# Patient Record
Sex: Male | Born: 1935 | Race: White | Hispanic: No | Marital: Married | Smoking: Never smoker
Health system: Southern US, Community
[De-identification: ages and names within clinical notes are randomized; demographics above are authoritative.]

## PROBLEM LIST (undated history)

## (undated) DIAGNOSIS — J90 Pleural effusion, not elsewhere classified: Secondary | ICD-10-CM

## (undated) DIAGNOSIS — I219 Acute myocardial infarction, unspecified: Secondary | ICD-10-CM

## (undated) DIAGNOSIS — G309 Alzheimer's disease, unspecified: Secondary | ICD-10-CM

## (undated) DIAGNOSIS — R002 Palpitations: Secondary | ICD-10-CM

## (undated) DIAGNOSIS — F0280 Dementia in other diseases classified elsewhere without behavioral disturbance: Secondary | ICD-10-CM

## (undated) DIAGNOSIS — G4734 Idiopathic sleep related nonobstructive alveolar hypoventilation: Secondary | ICD-10-CM

## (undated) DIAGNOSIS — C459 Mesothelioma, unspecified: Secondary | ICD-10-CM

## (undated) DIAGNOSIS — I469 Cardiac arrest, cause unspecified: Secondary | ICD-10-CM

## (undated) DIAGNOSIS — I959 Hypotension, unspecified: Secondary | ICD-10-CM

## (undated) DIAGNOSIS — C61 Malignant neoplasm of prostate: Secondary | ICD-10-CM

## (undated) DIAGNOSIS — E785 Hyperlipidemia, unspecified: Secondary | ICD-10-CM

## (undated) DIAGNOSIS — Z87442 Personal history of urinary calculi: Secondary | ICD-10-CM

## (undated) DIAGNOSIS — G931 Anoxic brain damage, not elsewhere classified: Secondary | ICD-10-CM

## (undated) DIAGNOSIS — R011 Cardiac murmur, unspecified: Secondary | ICD-10-CM

## (undated) DIAGNOSIS — J61 Pneumoconiosis due to asbestos and other mineral fibers: Secondary | ICD-10-CM

## (undated) DIAGNOSIS — J189 Pneumonia, unspecified organism: Secondary | ICD-10-CM

## (undated) DIAGNOSIS — I509 Heart failure, unspecified: Secondary | ICD-10-CM

## (undated) DIAGNOSIS — R0600 Dyspnea, unspecified: Secondary | ICD-10-CM

## (undated) DIAGNOSIS — Z8669 Personal history of other diseases of the nervous system and sense organs: Secondary | ICD-10-CM

## (undated) HISTORY — PX: TUBE THORACOSTOMY (ARMC HX): HXRAD1264

## (undated) HISTORY — DX: Anoxic brain damage, not elsewhere classified: G93.1

## (undated) HISTORY — DX: Malignant neoplasm of prostate: C61

## (undated) HISTORY — DX: Acute myocardial infarction, unspecified: I21.9

## (undated) HISTORY — DX: Cardiac murmur, unspecified: R01.1

## (undated) HISTORY — DX: Dementia in other diseases classified elsewhere, unspecified severity, without behavioral disturbance, psychotic disturbance, mood disturbance, and anxiety: F02.80

## (undated) HISTORY — PX: CARDIAC SURGERY: SHX584

## (undated) HISTORY — DX: Cardiac arrest, cause unspecified: I46.9

## (undated) HISTORY — DX: Pleural effusion, not elsewhere classified: J90

## (undated) HISTORY — DX: Alzheimer's disease, unspecified: G30.9

## (undated) HISTORY — DX: Hyperlipidemia, unspecified: E78.5

## (undated) HISTORY — DX: Mesothelioma, unspecified: C45.9

## (undated) HISTORY — PX: THORACENTESIS: SHX235

---

## 2007-10-17 HISTORY — PX: PROSTATECTOMY: SHX69

## 2008-11-15 HISTORY — PX: INGUINAL HERNIA REPAIR: SUR1180

## 2009-12-26 LAB — HM COLONOSCOPY

## 2010-05-18 DIAGNOSIS — I219 Acute myocardial infarction, unspecified: Secondary | ICD-10-CM

## 2010-05-18 HISTORY — DX: Acute myocardial infarction, unspecified: I21.9

## 2012-05-18 HISTORY — PX: CORONARY ARTERY BYPASS GRAFT: SHX141

## 2016-05-13 LAB — PULMONARY FUNCTION TEST

## 2016-10-15 LAB — BASIC METABOLIC PANEL
BUN: 14 (ref 4–21)
CREATININE: 0.8 (ref 0.6–1.3)
GLUCOSE: 74
POTASSIUM: 4 (ref 3.4–5.3)
SODIUM: 141 (ref 137–147)

## 2016-10-15 LAB — CBC AND DIFFERENTIAL
HCT: 39 — AB (ref 41–53)
Hemoglobin: 11.7 — AB (ref 13.5–17.5)
NEUTROS ABS: 4
Platelets: 218 (ref 150–399)
WBC: 5.1

## 2016-10-15 LAB — HEPATIC FUNCTION PANEL
ALT: 18 (ref 10–40)
AST: 19 (ref 14–40)
Alkaline Phosphatase: 78 (ref 25–125)
BILIRUBIN, TOTAL: 0.7

## 2016-10-15 LAB — PSA

## 2016-10-15 LAB — LIPID PANEL
Cholesterol: 103 (ref 0–200)
HDL: 37 (ref 35–70)
LDL CALC: 57
TRIGLYCERIDES: 46 (ref 40–160)

## 2016-11-17 ENCOUNTER — Encounter: Payer: Self-pay | Admitting: Neurology

## 2016-12-23 ENCOUNTER — Encounter: Payer: Self-pay | Admitting: Family Medicine

## 2016-12-23 ENCOUNTER — Encounter: Payer: Self-pay | Admitting: Adult Health

## 2016-12-23 ENCOUNTER — Ambulatory Visit (INDEPENDENT_AMBULATORY_CARE_PROVIDER_SITE_OTHER): Payer: Medicare Other | Admitting: Adult Health

## 2016-12-23 VITALS — BP 110/64 | Temp 97.5°F | Ht 67.25 in | Wt 127.0 lb

## 2016-12-23 DIAGNOSIS — C459 Mesothelioma, unspecified: Secondary | ICD-10-CM | POA: Diagnosis not present

## 2016-12-23 DIAGNOSIS — F0391 Unspecified dementia with behavioral disturbance: Secondary | ICD-10-CM | POA: Insufficient documentation

## 2016-12-23 DIAGNOSIS — Z7689 Persons encountering health services in other specified circumstances: Secondary | ICD-10-CM

## 2016-12-23 DIAGNOSIS — E782 Mixed hyperlipidemia: Secondary | ICD-10-CM | POA: Diagnosis not present

## 2016-12-23 DIAGNOSIS — E785 Hyperlipidemia, unspecified: Secondary | ICD-10-CM | POA: Insufficient documentation

## 2016-12-23 DIAGNOSIS — I219 Acute myocardial infarction, unspecified: Secondary | ICD-10-CM

## 2016-12-23 DIAGNOSIS — F03B18 Unspecified dementia, moderate, with other behavioral disturbance: Secondary | ICD-10-CM | POA: Insufficient documentation
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
## 2016-12-23 NOTE — Patient Instructions (Signed)
It was great meeting you today!  Someone from cardiology will call you to schedule your appointment with them   Please follow up with me for your physical in May 2019. If you need anything before that, please let me know

## 2016-12-24 ENCOUNTER — Ambulatory Visit (INDEPENDENT_AMBULATORY_CARE_PROVIDER_SITE_OTHER)
Admission: RE | Admit: 2016-12-24 | Discharge: 2016-12-24 | Disposition: A | Discharge status: Home / Self Care | Payer: Medicare Other | Source: Ambulatory Visit | Attending: Internal Medicine | Admitting: Internal Medicine

## 2016-12-24 ENCOUNTER — Other Ambulatory Visit (INDEPENDENT_AMBULATORY_CARE_PROVIDER_SITE_OTHER): Payer: Medicare Other

## 2016-12-24 ENCOUNTER — Encounter: Payer: Self-pay | Admitting: Internal Medicine

## 2016-12-24 ENCOUNTER — Ambulatory Visit (INDEPENDENT_AMBULATORY_CARE_PROVIDER_SITE_OTHER): Payer: Medicare Other | Admitting: Internal Medicine

## 2016-12-24 VITALS — BP 130/80 | HR 56 | Ht 68.0 in | Wt 127.7 lb

## 2016-12-24 DIAGNOSIS — J9611 Chronic respiratory failure with hypoxia: Secondary | ICD-10-CM

## 2016-12-24 DIAGNOSIS — C459 Mesothelioma, unspecified: Secondary | ICD-10-CM | POA: Diagnosis not present

## 2016-12-24 DIAGNOSIS — R0609 Other forms of dyspnea: Secondary | ICD-10-CM | POA: Diagnosis not present

## 2016-12-24 LAB — CBC WITH DIFFERENTIAL/PLATELET
BASOS ABS: 0 10*3/uL (ref 0.0–0.1)
Basophils Relative: 0.6 % (ref 0.0–3.0)
Eosinophils Absolute: 0.3 10*3/uL (ref 0.0–0.7)
Eosinophils Relative: 4.8 % (ref 0.0–5.0)
HEMATOCRIT: 41.4 % (ref 39.0–52.0)
Hemoglobin: 13 g/dL (ref 13.0–17.0)
LYMPHS ABS: 1.5 10*3/uL (ref 0.7–4.0)
LYMPHS PCT: 22.7 % (ref 12.0–46.0)
MCHC: 31.4 g/dL (ref 30.0–36.0)
MCV: 80 fl (ref 78.0–100.0)
MONOS PCT: 8 % (ref 3.0–12.0)
Monocytes Absolute: 0.5 10*3/uL (ref 0.1–1.0)
NEUTROS PCT: 63.9 % (ref 43.0–77.0)
Neutro Abs: 4.1 10*3/uL (ref 1.4–7.7)
Platelets: 226 10*3/uL (ref 150.0–400.0)
RBC: 5.18 Mil/uL (ref 4.22–5.81)
RDW: 18.2 % — ABNORMAL HIGH (ref 11.5–15.5)
WBC: 6.5 10*3/uL (ref 4.0–10.5)

## 2016-12-24 LAB — BASIC METABOLIC PANEL
BUN: 14 mg/dL (ref 6–23)
CALCIUM: 9.3 mg/dL (ref 8.4–10.5)
CO2: 34 mEq/L — ABNORMAL HIGH (ref 19–32)
CREATININE: 0.76 mg/dL (ref 0.40–1.50)
Chloride: 104 mEq/L (ref 96–112)
GFR: 104.47 mL/min (ref >60.00)
GLUCOSE: 88 mg/dL (ref 70–99)
Potassium: 3.9 mEq/L (ref 3.5–5.1)
Sodium: 141 mEq/L (ref 135–145)

## 2016-12-24 LAB — HEPATIC FUNCTION PANEL
ALT: 9 U/L (ref 0–53)
AST: 17 U/L (ref 0–37)
Albumin: 3.7 g/dL (ref 3.5–5.2)
Alkaline Phosphatase: 95 U/L (ref 39–117)
Bilirubin, Direct: 0.2 mg/dL (ref 0.0–0.3)
TOTAL PROTEIN: 7.3 g/dL (ref 6.0–8.3)
Total Bilirubin: 0.5 mg/dL (ref 0.2–1.2)

## 2016-12-24 LAB — SEDIMENTATION RATE: Sed Rate: 24 mm/hr — ABNORMAL HIGH (ref 0–20)

## 2016-12-24 LAB — BRAIN NATRIURETIC PEPTIDE: Pro B Natriuretic peptide (BNP): 222 pg/mL — ABNORMAL HIGH (ref 0.0–100.0)

## 2016-12-24 LAB — TSH: TSH: 1.51 u[IU]/mL (ref 0.35–4.50)

## 2016-12-24 NOTE — Patient Instructions (Addendum)
Wear the 02 at bedtime as much as you can  - see if you can get the company to help you turn the alarm off   Please remember to go to the lab and x-ray department downstairs in the basement  for your tests - we will call you with the results when they are available. Add :   Echo and eval by [facility] next steps

## 2016-12-24 NOTE — Progress Notes (Addendum)
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 Advice Only    Pt had a CT scan done 11/09/16 that stated that he had mesothelioma. Pt's wife stated that pt needs to have another biopsy but pt was in the process of moving and is now here for the referral. Pt does have SOB on exertion. Denies any coughing or CP. Pt was supposed to be on 2L O2 at bedtime but with the move was unable to get the O2.  sleeping one pillow on 2lpm - takes it off during the night s sequelae / owns concentrator  Doe = MMRC2 = can't walk a nl pace on a flat grade s sob but does fine slow and flat  No cough or cp an apparently good appetite   No obvious day to day or daytime variability or assoc excess/ purulent sputum or mucus plugs or hemoptysis or   chest tightness, subjective wheeze or overt sinus or hb symptoms. No other unusual exp hx or h/o childhood pna/ asthma or knowledge of premature birth.  Sleeping ok without nocturnal  or early am exacerbation  of respiratory  c/o's or need for noct saba. Also denies any obvious fluctuation of symptoms with weather or environmental changes or other aggravating or alleviating factors except as outlined above   Current Medications, Allergies, Complete Past Medical History, Past Surgical History, Family  History, and Social History were reviewed in [employer] record.  ROS  The following are not active complaints unless bolded sore throat, dysphagia, dental problems, itching, sneezing,  nasal congestion or excess/ purulent secretions, ear ache,   fever, chills, sweats, unintended wt loss, classically pleuritic or exertional cp,  orthopnea pnd or leg swelling, presyncope, palpitations, abdominal pain, anorexia, nausea, vomiting, diarrhea  or change in bowel or bladder habits, change in stools or urine, dysuria,hematuria,  rash, arthralgias, visual complaints, headache, numbness, weakness or ataxia or problems with walking or coordination,  change in mood/affect or memory.                 Review of Systems     Objective:   Physical Exam   Stoic disheveled elderly wm lets his wife do most of the talking, and when he does attempt to answer she typically cuts him off after just a word or two   Wt Readings from Last 3 Encounters:  12/24/16 127 lb 11.2 oz (57.9 kg)  12/23/16 127 lb (57.6 kg)    Vital signs reviewed - Note on arrival 02 sats  95% on RA      HEENT: nl dentition, turbinates bilaterally, and oropharynx. Nl external ear canals without cough reflex  NECK :  without JVD/Nodes/TM/ nl carotid upstrokes bilaterally   LUNGS: no acc muscle use,  Nl contour chest with decreased bs/ dullness R > L    CV:  RRR  no s3 or murmur or increase in P2, and no edema   ABD:  soft and nontender with nl inspiratory excursion in the supine position. No bruits or organomegaly appreciated, bowel sounds nl  MS:  Nl gait/ ext warm without deformities, calf tenderness, cyanosis or clubbing No obvious joint restrictions   SKIN: warm and dry without lesions    NEURO:  alert,  with  no motor or cerebellar deficits apparent - very poor recall recent events        Labs ordered/ reviewed:      Chemistry      Component Value Date/Time   NA 141 12/24/2016 1626    NA 141 10/15/2016   K 3.9 12/24/2016 1626   CL 104 12/24/2016 1626   CO2 34 (H) 12/24/2016 1626   BUN 14 12/24/2016 1626   BUN 14 10/15/2016   CREATININE 0.76 12/24/2016 1626   GLU 74 10/15/2016      Component Value Date/Time   CALCIUM 9.3 12/24/2016 1626   ALKPHOS 95 12/24/2016 1626   AST 17 12/24/2016 1626   ALT 9 12/24/2016 1626   BILITOT 0.5 12/24/2016 1626        Lab Results  Component Value Date   WBC 6.5 12/24/2016   HGB 13.0 12/24/2016   HCT 41.4 12/24/2016   MCV 80.0 12/24/2016   PLT 226.0 12/24/2016        Lab Results  Component Value Date   TSH 1.51 12/24/2016     Lab Results  Component Value Date   PROBNP 222.0 (H) 12/24/2016       Lab Results  Component Value Date   ESRSEDRATE 24 (H) 12/24/2016          I personally reviewed images and agree with radiology impression as follows:   Chest CT  11/09/16 Progression of extensive bilateral pleural based densities R >>L with mod r and smaller L loculated effusions and calcified pleural plaques    CXR PA and Lateral:   12/24/2016 :    I personally reviewed images - impression as follows:   R >>  L effusions with loculations      Assessment:

## 2016-12-25 ENCOUNTER — Institutional Professional Consult (permissible substitution): Payer: Self-pay | Admitting: Internal Medicine

## 2016-12-25 ENCOUNTER — Telehealth: Payer: Self-pay | Admitting: *Deleted

## 2016-12-25 ENCOUNTER — Telehealth: Payer: Self-pay | Admitting: Internal Medicine

## 2016-12-25 DIAGNOSIS — C459 Mesothelioma, unspecified: Secondary | ICD-10-CM

## 2016-12-25 DIAGNOSIS — J9611 Chronic respiratory failure with hypoxia: Secondary | ICD-10-CM | POA: Insufficient documentation

## 2016-12-25 NOTE — Assessment & Plan Note (Signed)
Presently minimally symptomatic and see no need to re - pursue a failed surgerical strategy on the R  Nor attempt any intervention on the L since the area is so small.  He could have a pleurex placed on the R if becomes more symptomatic and needs palliation  The other issue is whether making a tissue dx in this setting is worth trying again as this may not be in his best interest either given obvious geriactric/ cognitive decline and would like him to see Dr Earlie Server first for his thoughts re PET / re-attempt tissue dx.  Discussed in detail all the  indications, usual  risks and alternatives  relative to the benefits with patient/wife who agree to proceed with conservative f/u as outlined for now  Total time devoted to counseling  > 50 % of initial 60 min office visit:  review case with pt/ discussion of options/alternatives/ personally creating written customized instructions  in presence of pt  then going over those specific  Instructions directly with the pt including how to use all of the meds but in particular covering each new medication in detail and the difference between the maintenance= "automatic" meds and the prns using an action plan format for the latter (If this problem/symptom => do that organization reading Left to right).  Please see AVS from this visit for a full list of these instructions which I personally wrote for this pt and  are unique to this visit.

## 2016-12-25 NOTE — Assessment & Plan Note (Signed)
Placed on 02 in [place] ? 2017 > owns concentrator but not using consistently as of  12/24/2016   Not clear 02 is going to help much and pt refuses to keep it on at hs - explained to his wife we don't restrain chronically cognitively impaired pts to make them wear their 02 and she appeared to receptive to just doing the best we can here.  -  No obvious need for daytime 02 at this point

## 2016-12-25 NOTE — Telephone Encounter (Signed)
Called and spoke to pt. Informed him of the PET scan per MW. Order placed. Pt verbalized understanding and denied any further questions or concerns at this time.   LMTCB for Hinton Dyer to inform her of the PET scan.

## 2016-12-25 NOTE — Progress Notes (Signed)
Spoke with pt and notified of results per Dr. Wert. Pt verbalized understanding and denied any questions. 

## 2016-12-25 NOTE — Telephone Encounter (Signed)
Oncology Nurse Navigator Documentation  Oncology Nurse Navigator Flowsheets 12/25/2016  Navigator Location [facility]  Referral date to RadOnc/MedOnc 12/25/2016  Navigator Encounter Type Telephone/I received referral on Devin Hurst today.  I contacted several offices for the reason for referral.  I have received a fax from [name] office stating patient has mesothelioma.  I contacted patient and wife and update on appt to see [name] on 01/05/17 arrive at 1:30.   Telephone Outgoing Call  Treatment Phase Pre-Tx/Tx Discussion  Barriers/Navigation Needs Coordination of Care  Interventions Coordination of Care  Coordination of Care Appts;Other  Acuity Level 2  Acuity Level 2 Educational needs;Assistance expediting appointments  Time Spent with Patient 60

## 2016-12-25 NOTE — Assessment & Plan Note (Addendum)
-   Echo 08/11/16  EF 40-45% mild MR    Although bnp is in an intermediate range I see no clinical evidence of chf at this point but he does have h/o decreased ef/ MR by notes and of course any hydrostatic component to the effusions will have major impact on size of effusions  - may consider adding afterload reduction if sob worsens

## 2016-12-25 NOTE — Telephone Encounter (Signed)
Called and spoke to [place] with oncology. She states there is no enough information to go off of and they are needing a CT or PET scan and pathology.   Will send to MW to advise further. Thanks.    Patient Instructions   Wear the 02 at bedtime as much as you can  - see if you can get the company to help you turn the alarm off    Please remember to go to the lab and x-ray department downstairs in the basement  for your tests - we will call you with the results when they are available. Add :   Echo and eval by [facility] next steps

## 2016-12-25 NOTE — Telephone Encounter (Signed)
Go ahead and order the pet then

## 2016-12-25 NOTE — Addendum Note (Signed)
Addended by: Christinia Gully B on: 12/25/2016 07:48 AM   Modules accepted: Orders

## 2017-01-04 ENCOUNTER — Encounter (HOSPITAL_COMMUNITY)
Admission: RE | Admit: 2017-01-04 | Discharge: 2017-01-04 | Disposition: A | Discharge status: Home / Self Care | Payer: Medicare Other | Source: Ambulatory Visit | Attending: Internal Medicine | Admitting: Internal Medicine

## 2017-01-04 DIAGNOSIS — C459 Mesothelioma, unspecified: Secondary | ICD-10-CM | POA: Diagnosis present

## 2017-01-04 LAB — GLUCOSE, CAPILLARY: Glucose-Capillary: 96 mg/dL (ref 65–99)

## 2017-01-04 MED ORDER — FLUDEOXYGLUCOSE F - 18 (FDG) INJECTION
6.2400 | Freq: Once | INTRAVENOUS | Status: AC | PRN
  Administered 2017-01-04: 6.24 via INTRAVENOUS

## 2017-01-05 ENCOUNTER — Encounter: Payer: Self-pay | Admitting: Internal Medicine

## 2017-01-05 ENCOUNTER — Other Ambulatory Visit (HOSPITAL_BASED_OUTPATIENT_CLINIC_OR_DEPARTMENT_OTHER): Payer: Medicare Other

## 2017-01-05 ENCOUNTER — Ambulatory Visit (HOSPITAL_BASED_OUTPATIENT_CLINIC_OR_DEPARTMENT_OTHER): Payer: Medicare Other | Admitting: Internal Medicine

## 2017-01-05 VITALS — BP 126/72 | HR 53 | Temp 98.1°F | Resp 18 | Ht 68.0 in | Wt 128.7 lb

## 2017-01-05 DIAGNOSIS — C450 Mesothelioma of pleura: Secondary | ICD-10-CM

## 2017-01-05 DIAGNOSIS — C459 Mesothelioma, unspecified: Secondary | ICD-10-CM

## 2017-01-05 DIAGNOSIS — J9611 Chronic respiratory failure with hypoxia: Secondary | ICD-10-CM

## 2017-01-05 LAB — COMPREHENSIVE METABOLIC PANEL
ALBUMIN: 2.7 g/dL — AB (ref 3.5–5.0)
ALK PHOS: 94 U/L (ref 40–150)
ALT: 8 U/L (ref 0–55)
AST: 16 U/L (ref 5–34)
Anion Gap: 5 mEq/L (ref 3–11)
BILIRUBIN TOTAL: 0.33 mg/dL (ref 0.20–1.20)
BUN: 11.9 mg/dL (ref 7.0–26.0)
CALCIUM: 9.1 mg/dL (ref 8.4–10.4)
CO2: 32 mEq/L — ABNORMAL HIGH (ref 22–29)
CREATININE: 0.8 mg/dL (ref 0.7–1.3)
Chloride: 107 mEq/L (ref 98–109)
EGFR: 83 mL/min/{1.73_m2} — ABNORMAL LOW (ref >90)
Glucose: 84 mg/dl (ref 70–140)
POTASSIUM: 3.8 meq/L (ref 3.5–5.1)
Sodium: 144 mEq/L (ref 136–145)
TOTAL PROTEIN: 6.2 g/dL — AB (ref 6.4–8.3)

## 2017-01-05 LAB — CBC WITH DIFFERENTIAL/PLATELET
BASO%: 0.7 % (ref 0.0–2.0)
BASOS ABS: 0 10*3/uL (ref 0.0–0.1)
EOS%: 6 % (ref 0.0–7.0)
Eosinophils Absolute: 0.4 10*3/uL (ref 0.0–0.5)
HEMATOCRIT: 37.1 % — AB (ref 38.4–49.9)
HGB: 11.9 g/dL — ABNORMAL LOW (ref 13.0–17.1)
LYMPH#: 1.1 10*3/uL (ref 0.9–3.3)
LYMPH%: 18 % (ref 14.0–49.0)
MCH: 25.3 pg — AB (ref 27.2–33.4)
MCHC: 32.1 g/dL (ref 32.0–36.0)
MCV: 78.9 fL — ABNORMAL LOW (ref 79.3–98.0)
MONO#: 0.5 10*3/uL (ref 0.1–0.9)
MONO%: 7.2 % (ref 0.0–14.0)
NEUT#: 4.3 10*3/uL (ref 1.5–6.5)
NEUT%: 68.1 % (ref 39.0–75.0)
PLATELETS: 230 10*3/uL (ref 140–400)
RBC: 4.7 10*6/uL (ref 4.20–5.82)
RDW: 18.3 % — ABNORMAL HIGH (ref 11.0–14.6)
WBC: 6.3 10*3/uL (ref 4.0–10.3)

## 2017-01-05 NOTE — Progress Notes (Signed)
[place] [phone]   Fax:([phone]  CONSULT NOTE  REFERRING PHYSICIAN: Dr. Christinia Gully  REASON FOR CONSULTATION:  83 years old white male with questionable mesothelioma.  HPI Devin Hurst is a 83 y.o. male with past medical history significant for multiple medical problems including Alzheimer, cardiac arrest in 2014 status post CABG, dyslipidemia, and history of prostate cancer status post surgery and radiotherapy in 2009. The patient mentions that he was in the hospital in [place] he years ago for questionable reaction to Ambien. During his evaluation he was found to have bilateral pleural effusion right more than left his imaging studies at that time also showed bilateral pleural plaques suspicious for mesothelioma. He was seen by thoracic surgery at that time and he hasn't right VATS followed by talc pleurodesis but no diagnosis of mesothelioma was confirmed. The patient has a history of working on a [employer] many years ago. He was followed by observation but recently moved to [facility]. His last CT scan performed on 11/09/2016 showed significant progression of extensive bilateral pleural based density right more than left with bilateral posteromedial calcified pleural plaques. The patient was seen by Dr. Melvyn Novas and he ordered a PET scan which was performed on 01/04/2017 and it showed right-sided suspicious mesothelioma with evidence of chest wall involvement with extension through the posterior lateral and anterior rib interspace. There was no hypermetabolic mediastinal or hilar lymph nodes and no new evidence of distant metastatic disease to the abdomen or pelvis. There was loculated left pleural effusion with calcified pleural plaques secondary to be suspicious mesothelioma. Dr. Melvyn Novas kindly referred the patient to me today for evaluation and recommendation regarding his condition. When seen today the patient is feeling fine with  no specific complaints. He has significant dementia and his wife was providing the history. He denied having any chest pain, shortness breath, cough or hemoptysis. He has no significant weight loss or night sweats. He has no nausea, vomiting, diarrhea or constipation. The patient denied having any fever or chills. He has no headache or visual changes. Family history significant for mother who died at age 46 from heart disease and father died at young age in the war. The patient is married and has 3 children. He was accompanied today by his wife Devin Hurst. He used to work as Customer service manager. He also worked in the [employer] but mainly in [employer]. He has no history of smoking and no history of alcohol or drug abuse. HPI  Past Medical History:  Diagnosis Date  . Alzheimer disease   . Anoxic brain injury ([place])   . Cardiac arrest ([place])   . Heart attack [facility]) 2012   Cardiac Arrest   . Heart murmur   . Hyperlipidemia   . Kidney stones   . 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 Pleural effusion    multiple  . Prostate cancer [facility])     Past Surgical History:  Procedure Laterality Date  . CARDIAC SURGERY  11/2012   Triple Bypass  . INGUINAL HERNIA REPAIR  11/2008  . PROSTATECTOMY  10/2007  . THORACENTESIS    . TUBE THORACOSTOMY ([facility] HX)      Family History  Problem Relation Age of Onset  . Heart disease Mother   . Heart disease Brother     Social History Social History  Substance Use Topics  . Smoking status: Never Smoker  . Smokeless tobacco: Never Used  . Alcohol use No  Allergies  Allergen Reactions  . Erythromycin Nausea Only  . Zithromax [Azithromycin] Nausea Only    Current Outpatient Prescriptions  Medication Sig Dispense Refill  . aspirin EC 81 MG tablet Take 81 mg by mouth daily.    Marland Kitchen atorvastatin (LIPITOR) 40 MG tablet Take 40 mg by mouth at bedtime.    . Cholecalciferol (VITAMIN D3) 5000 units CAPS Take 1 capsule by mouth daily.    . furosemide (LASIX) 20 MG tablet  Take 20 mg by mouth 3 (three) times a week.    . memantine (NAMENDA) 10 MG tablet Take 10 mg by mouth 2 (two) times daily.    . metoprolol succinate (TOPROL-XL) 25 MG 24 hr tablet Take 25 mg by mouth daily.    . Multiple Vitamins-Minerals (MACULAR VITAMIN BENEFIT) TABS Take 1 tablet by mouth daily.    . OXYGEN Inhale into the lungs. 2 liters at bedtime    . potassium chloride (K-DUR,KLOR-CON) 10 MEQ tablet 10 meq three times weekly    . predniSONE (DELTASONE) 2.5 MG tablet Take 2.5 mg by mouth every other day.    . rivastigmine (EXELON) 9.5 mg/24hr Place 9.5 mg onto the skin daily.    Marland Kitchen thiamine 100 MG tablet Take 100 mg by mouth daily.    . traZODone (DESYREL) 100 MG tablet Take 100 mg by mouth at bedtime as needed for sleep.     No current facility-administered medications for this visit.     Review of Systems  Constitutional: negative Eyes: negative Ears, nose, mouth, throat, and face: negative Respiratory: negative Cardiovascular: negative Gastrointestinal: negative Genitourinary:negative Integument/breast: negative Hematologic/lymphatic: negative Musculoskeletal:negative Neurological: positive for memory problems Behavioral/Psych: negative Endocrine: negative Allergic/Immunologic: negative  Physical Exam  ZCH:YIFOY, healthy, no distress, well nourished and well developed SKIN: skin color, texture, turgor are normal, no rashes or significant lesions HEAD: Normocephalic, No masses, lesions, tenderness or abnormalities EYES: normal, PERRLA, Conjunctiva are pink and non-injected EARS: External ears normal, Canals clear OROPHARYNX:no exudate, no erythema and lips, buccal mucosa, and tongue normal  NECK: supple, no adenopathy, no JVD LYMPH:  no palpable lymphadenopathy, no hepatosplenomegaly LUNGS: decreased breath sounds HEART: regular rate & rhythm and no murmurs ABDOMEN:abdomen soft, non-tender, normal bowel sounds and no masses or organomegaly BACK: Back symmetric, no  curvature., No CVA tenderness EXTREMITIES:no joint deformities, effusion, or inflammation, no edema  NEURO: alert & oriented x 3 with fluent speech, no focal motor/sensory deficits  PERFORMANCE STATUS: ECOG 1  LABORATORY DATA: Lab Results  Component Value Date   WBC 6.3 01/05/2017   HGB 11.9 (L) 01/05/2017   HCT 37.1 (L) 01/05/2017   MCV 78.9 (L) 01/05/2017   PLT 230 01/05/2017      Chemistry      Component Value Date/Time   NA 144 01/05/2017 1340   K 3.8 01/05/2017 1340   CL 104 12/24/2016 1626   CO2 32 (H) 01/05/2017 1340   BUN 11.9 01/05/2017 1340   CREATININE 0.8 01/05/2017 1340   GLU 74 10/15/2016      Component Value Date/Time   CALCIUM 9.1 01/05/2017 1340   ALKPHOS 94 01/05/2017 1340   AST 16 01/05/2017 1340   ALT 8 01/05/2017 1340   BILITOT 0.33 01/05/2017 1340       RADIOGRAPHIC STUDIES: Dg Chest 2 View  Result Date: 12/25/2016 CLINICAL DATA:  F/u on chronic pleural effusion, sob on exertion, non smoker, hx of mesothelioma. EXAM: CHEST  2 VIEW COMPARISON:  None. FINDINGS: Prior median sternotomy. Midline trachea. Moderate cardiomegaly.  Atherosclerosis in the transverse aorta. Moderate right and small left pleural effusions. Probable loculation about the right-sided effusion laterally. There are rounded areas of pleural-based soft tissue density which are suspicious for pleural masses. Example inferior laterally on the left and laterally on the right. Probable inferomedial right-sided pleural-based masses well. No pneumothorax. Mild left base atelectasis. Right infrahilar and medial right lower lobe airspace disease. IMPRESSION: Right greater than left pleural effusions with apparent pleural-based masses. This likely corresponds to the clinical history of mesothelioma. Mild left base atelectasis. Right base airspace disease which could represent atelectasis or concurrent infection. Cardiomegaly with aortic atherosclerosis. Electronically Signed   By: Abigail Miyamoto M.D.    On: 12/25/2016 08:42   Nm Pet Image Initial (pi) Skull Base To Thigh  Result Date: 01/04/2017 CLINICAL DATA:  Initial treatment strategy for mesothelioma EXAM: NUCLEAR MEDICINE PET SKULL BASE TO THIGH TECHNIQUE: 6.24 mCi F-18 FDG was injected intravenously. Full-ring PET imaging was performed from the skull base to thigh after the radiotracer. CT data was obtained and used for attenuation correction and anatomic localization. FASTING BLOOD GLUCOSE:  Value: 96 mg/dl COMPARISON:  None. FINDINGS: NECK: No hypermetabolic lymph nodes in the neck. CHEST: The heart size is enlarged. There is aortic atherosclerosis. No pericardial effusion. Previous median sternotomy and CABG procedure. No hypermetabolic mediastinal or hilar lymph nodes. Bilateral calcified pleural plaques are identified compatible with prior asbestos exposure. Bilateral loculated pleural effusions are identified right greater than left. Diffuse circumferential soft tissue thickening overlying the right lung is identified. There is mild diffuse car spine increased radiotracer uptake compatible with the clinical history of mesothelioma. Several more focal areas of moderate to marked increased uptake are also identified. For example overlying the posterior left lung there is a focal area of increased uptake within SUV max equal to 8.18. At a slightly more caudal level there is a focal area of increased uptake within SUV max equal to 6.45. Overlying the right upper lobe SUV max is equal to 4.85. Areas of posterolateral chest wall involvement with extension through right seventh/eighth and eighth/ninth rib interspaces are identified with an SUV max equal to 2.65, image 89 of series 4. There is also evidence of chest wall involvement anteriorly, image 105 of series 4. Here, SUV max equal to 2.37. Small parenchymal nodule in the left upper lobe measures 3 mm, image 16 of series 7. ABDOMEN/PELVIS: No abnormal hypermetabolic activity within the liver,  pancreas, adrenal glands, or spleen. No hypermetabolic lymph nodes in the abdomen or pelvis. Aortic atherosclerosis. Infrarenal abdominal aortic aneurysm measures 4.8 cm in AP dimension. SKELETON: No focal hypermetabolic activity to suggest skeletal metastasis. IMPRESSION: 1. Right-sided mesothelioma identified. Evidence of chest wall involvement with extension through the posterolateral and anterior rib interspace is identified. 2. No hypermetabolic mediastinal or hilar lymph nodes and no new evidence for distant metastatic disease to the abdomen or pelvis. 3. Loculated left pleural effusion with calcified pleural plaque secondary to mesothelioma 4. Cardiac enlargement, aortic atherosclerosis and changes of prior CABG procedure. Electronically Signed   By: Kerby Moors M.D.   On: 01/04/2017 10:14    ASSESSMENT: This is a very pleasant 83 years old white male with highly suspicious bilateral mesothelioma but no tissue confirmation. The patient presented with bilateral pleural fluid as well as bilateral pleural plaques status post right talc pleurodesis. His initial diagnosis was in [place] a few years ago. He has been observation since that time.   PLAN: I had a lengthy discussion with the patient  and his wife today about his current disease status and treatment options. 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 I explained to the patient his wife that the treatment will be with palliative systemic chemotherapy versus palliative care and close monitoring. The patient is not interested in any systemic chemotherapy and also he is not interested in consideration of repeat biopsy for tissue confirmation. I agreed with his decision. His wife is also in agreement with this plan taken into consideration his age and comorbidities. He will continue on  observation and close monitoring as well as symptom management by his primary care physician or pulmonologist. I would also be happy to see the patient in the future if needed for symptom management. He was advised to call immediately if he has any concerning symptoms in the interval.  The patient voices understanding of current disease status and treatment options and is in agreement with the current care plan.  All questions were answered. The patient knows to call the clinic with any problems, questions or concerns. We can certainly see the patient much sooner if necessary.  Thank you so much for allowing me to participate in the care of Devin Hurst. I will continue to follow up the patient with you and assist in his care.  I spent 40 minutes counseling the patient face to face. The total time spent in the appointment was 60 minutes.  Disclaimer: This note was dictated with voice recognition software. Similar sounding words can inadvertently be transcribed and may not be corrected upon review.   Eilleen Kempf January 05, 2017, 2:40 PM

## 2017-01-06 ENCOUNTER — Telehealth: Payer: Self-pay | Admitting: Internal Medicine

## 2017-01-06 NOTE — Telephone Encounter (Signed)
No follow-up visit is needed. 8/21 los.

## 2017-01-07 ENCOUNTER — Telehealth: Payer: Self-pay | Admitting: Internal Medicine

## 2017-01-07 NOTE — Telephone Encounter (Signed)
Spoke with pt's wife, Remo Lipps. Remo Lipps request that MW return her call at [phone], as she missed his call yesterday.  MW please advise. Thanks/

## 2017-01-07 NOTE — Telephone Encounter (Signed)
Discussed with pt's wife - he has declined any consideration for chemo/RT and clearly not a surgical candidate so no indication for bx at this point and focus should be on treating symptoms when they evolved with pallliative care/hospice which can be arranged through PCP or oncology/ no pulmonary f/u needed   Copy of this note please to [employer]

## 2017-01-08 ENCOUNTER — Encounter: Payer: Self-pay | Admitting: Neurology

## 2017-01-08 ENCOUNTER — Ambulatory Visit (INDEPENDENT_AMBULATORY_CARE_PROVIDER_SITE_OTHER): Payer: Medicare Other | Admitting: Neurology

## 2017-01-08 VITALS — BP 104/66 | HR 56 | Ht 68.0 in | Wt 126.0 lb

## 2017-01-08 DIAGNOSIS — F0390 Unspecified dementia without behavioral disturbance: Secondary | ICD-10-CM

## 2017-01-08 DIAGNOSIS — F03B0 Unspecified dementia, moderate, without behavioral disturbance, psychotic disturbance, mood disturbance, and anxiety: Secondary | ICD-10-CM

## 2017-01-08 MED ORDER — RIVASTIGMINE 9.5 MG/24HR TD PT24: 9.5000 mg | MEDICATED_PATCH | Freq: Every day | TRANSDERMAL | 3 refills | Status: AC

## 2017-01-08 MED ORDER — MEMANTINE HCL 10 MG PO TABS: 10.0000 mg | ORAL_TABLET | Freq: Two times a day (BID) | ORAL | 3 refills | Status: AC
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
## 2017-01-08 NOTE — Telephone Encounter (Signed)
I am sorry to hear about his findings. I received a note from Dr. Melvyn Novas informing me of what is going on. Does his wife feel as though Devin Hurst needs palliative care at this time? We would be happy to set it up so that they can come out to the house, evaluate and see what they can help with

## 2017-01-08 NOTE — Telephone Encounter (Signed)
Copy of this note has been sent to the NP per MW recs.

## 2017-01-08 NOTE — Patient Instructions (Signed)
1. Continue all your medications 2. Control of blood pressure, cholesterol, as well as physical exercise and brain stimulation exercises are important for brain health 3. Start looking into getting more help in home 4. Support groups can be helpful, please look into the Alzheimer Association website of [place]: [email or web address].za 5. Follow-up in 6 months, call for any changes

## 2017-01-08 NOTE — Telephone Encounter (Signed)
Left a message for a return call.

## 2017-01-12 NOTE — Telephone Encounter (Signed)
Left a message for a return call.

## 2017-01-13 ENCOUNTER — Encounter: Payer: Self-pay | Admitting: *Deleted

## 2017-01-14 NOTE — Telephone Encounter (Signed)
Spoke to Remo Lipps (wife) and she stated she does not wish to order palliative care at this time.  Pt is not symptomatic.  Will call back when she thinks it is time.

## 2017-01-15 ENCOUNTER — Encounter: Payer: Self-pay | Admitting: Neurology

## 2017-01-27 ENCOUNTER — Other Ambulatory Visit: Payer: Self-pay | Admitting: Pediatrics

## 2017-01-27 MED ORDER — ATORVASTATIN CALCIUM 40 MG PO TABS: 40.0000 mg | ORAL_TABLET | Freq: Every day | ORAL | 3 refills | Status: AC

## 2017-01-27 MED ORDER — METOPROLOL SUCCINATE ER 25 MG PO TB24: 25.0000 mg | ORAL_TABLET | Freq: Every day | ORAL | 3 refills | Status: AC

## 2017-01-27 NOTE — Telephone Encounter (Signed)
This appears to be the initial rx (from you) for the attached medication. Please advise.

## 2017-01-27 NOTE — Telephone Encounter (Signed)
It is ok to refill, he is seeing cardiology next week

## 2017-02-04 ENCOUNTER — Ambulatory Visit (INDEPENDENT_AMBULATORY_CARE_PROVIDER_SITE_OTHER): Payer: Medicare Other | Admitting: Internal Medicine

## 2017-02-04 ENCOUNTER — Encounter (INDEPENDENT_AMBULATORY_CARE_PROVIDER_SITE_OTHER): Payer: Self-pay

## 2017-02-04 ENCOUNTER — Encounter: Payer: Self-pay | Admitting: Internal Medicine

## 2017-02-04 VITALS — BP 122/74 | HR 69 | Ht 68.0 in | Wt 125.8 lb

## 2017-02-04 DIAGNOSIS — E782 Mixed hyperlipidemia: Secondary | ICD-10-CM | POA: Diagnosis not present

## 2017-02-04 DIAGNOSIS — C459 Mesothelioma, unspecified: Secondary | ICD-10-CM | POA: Diagnosis not present

## 2017-02-04 DIAGNOSIS — F0390 Unspecified dementia without behavioral disturbance: Secondary | ICD-10-CM

## 2017-02-04 DIAGNOSIS — Z0000 Encounter for general adult medical examination without abnormal findings: Secondary | ICD-10-CM | POA: Diagnosis not present

## 2017-02-04 DIAGNOSIS — I2510 Atherosclerotic heart disease of native coronary artery without angina pectoris: Secondary | ICD-10-CM | POA: Diagnosis not present
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
## 2017-02-04 NOTE — Patient Instructions (Addendum)
Your physician recommends that you continue on your current medications as directed. Please refer to the Current Medication list given to you today. Your physician wants you to follow-up in: June, 2019 with Dr. Harrington Challenger.  You will receive a reminder letter in the mail two months in advance. If you don't receive a letter, please call our office to schedule the follow-up appointment.  Addendum: ROI signed to obtain previous cardiology records.

## 2017-02-05 ENCOUNTER — Telehealth: Payer: Self-pay | Admitting: Internal Medicine

## 2017-02-05 NOTE — Telephone Encounter (Signed)
Records received From [facility]. Placed in Chart Prep.

## 2017-03-24 ENCOUNTER — Ambulatory Visit: Payer: Medicare Other | Admitting: Adult Health

## 2017-05-03 ENCOUNTER — Other Ambulatory Visit: Payer: Self-pay | Admitting: Adult Health

## 2017-05-04 ENCOUNTER — Encounter: Payer: Self-pay | Admitting: Adult Health

## 2017-05-04 ENCOUNTER — Ambulatory Visit (INDEPENDENT_AMBULATORY_CARE_PROVIDER_SITE_OTHER): Payer: Medicare Other | Admitting: Adult Health

## 2017-05-04 VITALS — BP 104/70 | Temp 97.5°F | Wt 120.0 lb

## 2017-05-04 DIAGNOSIS — I2510 Atherosclerotic heart disease of native coronary artery without angina pectoris: Secondary | ICD-10-CM | POA: Diagnosis not present

## 2017-05-04 DIAGNOSIS — L02412 Cutaneous abscess of left axilla: Secondary | ICD-10-CM | POA: Diagnosis not present

## 2017-05-04 MED ORDER — DOXYCYCLINE HYCLATE 100 MG PO CAPS: 100.0000 mg | ORAL_CAPSULE | Freq: Two times a day (BID) | ORAL | 0 refills | Status: DC

## 2017-05-04 NOTE — Telephone Encounter (Signed)
Sent to the pharmacy by e-scribe. 

## 2017-05-04 NOTE — Progress Notes (Signed)
Subjective:    Patient ID: Devin Hurst, male    DOB: 06/15/1933, 83 y.o.   MRN: [id number]  HPI  83 year old male who  has a past medical history of Alzheimer disease, Anoxic brain injury ([place]), Cardiac arrest ([place]), Heart attack ([place]) (2012), Heart murmur, Hyperlipidemia, Kidney stones, Mesothelioma [facility]), Pleural effusion, and Prostate cancer ([place]). He presents with his wife to this visit for an acute concern of abscess in his left axilla. His wife reports that it was first noticed about a week ago and started out as a " small bump " but has grown larger as the week progressed. She denies any drainage. [name] denies any pain but his wife reports " he complains about it."  Review of Systems See HPI   Past Medical History:  Diagnosis Date  . Alzheimer disease   . Anoxic brain injury ([place])   . Cardiac arrest ([place])   . Heart attack [facility]) 2012   Cardiac Arrest   . Heart murmur   . Hyperlipidemia   . Kidney stones   . 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 Pleural effusion    multiple  . Prostate cancer [facility])     Social History   Socioeconomic History  . Marital status: Married    Spouse name: Not on file  . Number of children: Not on file  . Years of education: Not on file  . Highest education level: Not on file  Social Needs  . Financial resource strain: Not on file  . Food insecurity - worry: Not on file  . Food insecurity - inability: Not on file  . Transportation needs - medical: Not on file  . Transportation needs - non-medical: Not on file  Occupational History  . Not on file  Tobacco Use  . Smoking status: Never Smoker  . Smokeless tobacco: Never Used  Substance and Sexual Activity  . Alcohol use: No  . Drug use: No  . Sexual activity: Not on file  Other Topics Concern  . Not on file  Social History Narrative   Pt lives in 1 story home with his wife   Has 3 adult children   Retired Event organiser   Some college    Past Surgical History:  Procedure  Laterality Date  . CARDIAC SURGERY  11/2012   Triple Bypass  . INGUINAL HERNIA REPAIR  11/2008  . PROSTATECTOMY  10/2007  . THORACENTESIS    . TUBE THORACOSTOMY ([facility])      Family History  Problem Relation Age of Onset  . Heart disease Mother   . Heart disease Brother     No Active Allergies  Current Outpatient Medications on File Prior to Visit  Medication Sig Dispense Refill  . aspirin EC 81 MG tablet Take 81 mg by mouth daily.    Marland Kitchen atorvastatin (LIPITOR) 40 MG tablet Take 1 tablet (40 mg total) by mouth at bedtime. 90 tablet 3  . Cholecalciferol (VITAMIN D3) 5000 units CAPS Take 1 capsule by mouth daily.    . furosemide (LASIX) 20 MG tablet Take 20 mg by mouth 3 (three) times a week.    . memantine (NAMENDA) 10 MG tablet Take 1 tablet (10 mg total) by mouth 2 (two) times daily. 180 tablet 3  . metoprolol succinate (TOPROL-XL) 25 MG 24 hr tablet Take 1 tablet (25 mg total) by mouth daily. 90 tablet 3  . Multiple Vitamins-Minerals (MACULAR VITAMIN BENEFIT) TABS Take 1 tablet by mouth daily.    Marland Kitchen  OXYGEN Inhale into the lungs. 2 liters at bedtime    . potassium chloride (K-DUR,KLOR-CON) 10 MEQ tablet 10 meq three times weekly    . predniSONE (DELTASONE) 2.5 MG tablet Take 2.5 mg by mouth every other day.    . predniSONE (DELTASONE) 5 MG tablet TAKE 1 TABLET BY MOUTH DAILY 90 tablet 1  . rivastigmine (EXELON) 9.5 mg/24hr Place 1 patch (9.5 mg total) onto the skin daily. 90 patch 3  . thiamine 100 MG tablet Take 100 mg by mouth daily.    . traZODone (DESYREL) 100 MG tablet Take 100 mg by mouth at bedtime as needed for sleep.     No current facility-administered medications on file prior to visit.     BP 104/70 (BP Location: Left Arm)   Temp (!) 97.5 F (36.4 C) (Temporal)   Wt 120 lb (54.4 kg)   BMI 18.25 kg/m       Objective:   Physical Exam  Constitutional: He is oriented to person, place, and time. He appears well-developed and well-nourished. No distress.    Cardiovascular: Normal rate, regular rhythm, normal heart sounds and intact distal pulses. Exam reveals no gallop and no friction rub.  No murmur heard. Pulmonary/Chest: Effort normal and breath sounds normal. No respiratory distress. He has no wheezes. He has no rales. He exhibits no tenderness.  Neurological: He is alert and oriented to person, place, and time.  Skin: Skin is warm and dry. No rash noted. He is not diaphoretic. There is erythema. No pallor.  Quarter sized cutaneous abscess noted in left axilla. localized erythema. Fluctuant to touch. No active draining but has come to a head    Psychiatric: He has a normal mood and affect. His behavior is normal. Judgment and thought content normal.  Nursing note and vitals reviewed.     Assessment & Plan:  1. Cutaneous abscess of left axilla  Procedure:  Incision and drainage of abscess Risks, benefits, and alternatives explained and consent obtained. Time out conducted. Surface cleaned with alcohol and betadine  1.5  cc lidocaine without epinephine infiltrated around abscess. Adequate anesthesia ensured. Area prepped and draped in a sterile fashion. #11 blade used to make a stab incision into abscess. Pus expressed with pressure. Curved hemostat used to explore 4 quadrants and loculations broken up. Further purulence expressed. 0.5 inches of iodoform packing placed leaving a 1-inch tail. Hemostasis achieved. Pt stable. Aftercare and follow-up advised. - doxycycline (VIBRAMYCIN) 100 MG capsule; Take 1 capsule (100 mg total) by mouth 2 (two) times daily.  Dispense: 20 capsule; Refill: 0  Dorothyann Peng, NP

## 2017-05-04 NOTE — Telephone Encounter (Signed)
Ok to refill 90 +1  

## 2017-05-07 ENCOUNTER — Telehealth: Payer: Self-pay | Admitting: Neurology

## 2017-05-07 NOTE — Telephone Encounter (Signed)
Spoke with pt's wife relaying message below.  She was appreciative of my call.

## 2017-05-07 NOTE — Telephone Encounter (Signed)
Returned call to pt's wife, Remo Lipps.  She states that for the past 4 days pt has been seeing people walking through their house during the morning hours.  Wife originally thought that pt was still "waking up" and just a little confused, but states that today it was obvious that he was fully awake when he saw these "people".  He seemed to get agitated stating that "They sure have a lot of nerve taking a short cut through my house at [place]."  Wife stated that she just wanted to make sure that Dr. Delice Lesch was aware.  I let her know that I would pass the message and return call if Dr. Delice Lesch has any recommendations.

## 2017-05-07 NOTE — Telephone Encounter (Signed)
Pt's spouse called and said pt is having hallucinations and would like a call back regarding this

## 2017-05-07 NOTE — Telephone Encounter (Signed)
Pls let wife know I reviewed records, it appears he had a skin infection and is on antibiotics. Sometimes when patients with dementia have an infection, it can make them more confused. See how he does over the next few days, I think he is still on the antibiotics. If condition worsens, go to ER, otherwise update our office if hallucinations still ongoing in a week. Thanks

## 2017-05-26 ENCOUNTER — Telehealth: Payer: Self-pay | Admitting: Adult Health

## 2017-05-26 NOTE — Telephone Encounter (Signed)
Spoke to [place] and she stated the pt has a lump half way up his chest.  It is not hard.  Does not seem to bother the pt.  Advised that it should be examined by the provider.  She agreed and appt made to see [facility] on 06/03/17

## 2017-05-26 NOTE — Telephone Encounter (Signed)
Copied from [place] [id number]. Topic: Inquiry >> May 26, 2017 10:58 AM Malena Catholic I, NT wrote: Reason for CRM: Pt call because his have a mass on his R side of his Chest and want to know what to do His want to Doctor Nafziger To give him a Call.Thanks

## 2017-06-03 ENCOUNTER — Ambulatory Visit (INDEPENDENT_AMBULATORY_CARE_PROVIDER_SITE_OTHER): Payer: Medicare Other | Admitting: Adult Health

## 2017-06-03 ENCOUNTER — Encounter: Payer: Self-pay | Admitting: Adult Health

## 2017-06-03 VITALS — BP 122/74 | Temp 97.5°F | Wt 119.0 lb

## 2017-06-03 DIAGNOSIS — R222 Localized swelling, mass and lump, trunk: Secondary | ICD-10-CM | POA: Diagnosis not present

## 2017-06-03 NOTE — Progress Notes (Signed)
Subjective:    Patient ID: Devin Hurst, male    DOB: 1934/03/29, 84 y.o.   MRN: [id number]  HPI  84 year old male who  has a past medical history of Alzheimer disease, Anoxic brain injury ([place]), Cardiac arrest ([place]), Heart attack ([place]) (2012), Heart murmur, Hyperlipidemia, Kidney stones, Mesothelioma [facility]), Pleural effusion, and Prostate cancer ([place]).  He presents with his wife to this visit.  She reports that last week she noticed a mass on the right side of his rib cage.  She had not noticed this previously.  Patient denies any pain with palpation and he is unsure of when he originally noticed the mass.  Review of Systems  See HPI   Past Medical History:  Diagnosis Date  . Alzheimer disease   . Anoxic brain injury ([place])   . Cardiac arrest ([place])   . Heart attack [facility]) 2012   Cardiac Arrest   . Heart murmur   . Hyperlipidemia   . Kidney stones   . 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 Pleural effusion    multiple  . Prostate cancer [facility])     Social History   Socioeconomic History  . Marital status: Married    Spouse name: Not on file  . Number of children: Not on file  . Years of education: Not on file  . Highest education level: Not on file  Social Needs  . Financial resource strain: Not on file  . Food insecurity - worry: Not on file  . Food insecurity - inability: Not on file  . Transportation needs - medical: Not on file  . Transportation needs - non-medical: Not on file  Occupational History  . Not on file  Tobacco Use  . Smoking status: Never Smoker  . Smokeless tobacco: Never Used  Substance and Sexual Activity  . Alcohol use: No  . Drug use: No  . Sexual activity: Not on file  Other Topics Concern  . Not on file  Social History Narrative   Pt lives in 1 story home with his wife   Has 3 adult children   Retired Event organiser   Some college    Past Surgical History:  Procedure Laterality Date  . CARDIAC SURGERY  11/2012   Triple Bypass  .  INGUINAL HERNIA REPAIR  11/2008  . PROSTATECTOMY  10/2007  . THORACENTESIS    . TUBE THORACOSTOMY ([facility])      Family History  Problem Relation Age of Onset  . Heart disease Mother   . Heart disease Brother     No Active Allergies  Current Outpatient Medications on File Prior to Visit  Medication Sig Dispense Refill  . aspirin EC 81 MG tablet Take 81 mg by mouth daily.    Marland Kitchen atorvastatin (LIPITOR) 40 MG tablet Take 1 tablet (40 mg total) by mouth at bedtime. 90 tablet 3  . Cholecalciferol (VITAMIN D3) 5000 units CAPS Take 1 capsule by mouth daily.    . furosemide (LASIX) 20 MG tablet Take 20 mg by mouth 3 (three) times a week.    . memantine (NAMENDA) 10 MG tablet Take 1 tablet (10 mg total) by mouth 2 (two) times daily. 180 tablet 3  . metoprolol succinate (TOPROL-XL) 25 MG 24 hr tablet Take 1 tablet (25 mg total) by mouth daily. 90 tablet 3  . Multiple Vitamins-Minerals (MACULAR VITAMIN BENEFIT) TABS Take 1 tablet by mouth daily.    . OXYGEN Inhale into the lungs. 2 liters at bedtime    .  potassium chloride (K-DUR,KLOR-CON) 10 MEQ tablet 10 meq three times weekly    . predniSONE (DELTASONE) 5 MG tablet TAKE 1 TABLET BY MOUTH DAILY 90 tablet 1  . rivastigmine (EXELON) 9.5 mg/24hr Place 1 patch (9.5 mg total) onto the skin daily. 90 patch 3  . thiamine 100 MG tablet Take 100 mg by mouth daily.    . traZODone (DESYREL) 100 MG tablet Take 100 mg by mouth at bedtime as needed for sleep.     No current facility-administered medications on file prior to visit.     BP 122/74 (BP Location: Left Arm)   Temp (!) 97.5 F (36.4 C) (Oral)   Wt 119 lb (54 kg)   BMI 18.09 kg/m       Objective:   Physical Exam  Constitutional: He is oriented to person, place, and time. He appears well-developed. He appears cachectic. No distress.  Neurological: He is alert and oriented to person, place, and time.  Skin: Skin is warm and dry. He is not diaphoretic.  6 cm x 6 cm semi-fluctuant mass  over right rib cage.  This mass is movable and does not cause any pain.  No redness or warmth noted  Psychiatric: He has a normal mood and affect. His behavior is normal.  Nursing note and vitals reviewed.     Assessment & Plan:  1. Mass in chest \-Likely lipoma but due to medical history will refer to general surgery for biopsy - Ambulatory referral to [place], NP

## 2017-06-14 ENCOUNTER — Ambulatory Visit: Payer: Self-pay | Admitting: Surgery

## 2017-06-14 NOTE — H&P (Signed)
History of Present Illness Devin Hurst. Devin Hurst; 06/14/2017 11:44 AM) The patient is a 84 year old male who presents with a complaint of Mass. Referred by Dorothyann Peng NP  This is an 84 year old male with Alzheimer's and several other medical comorbidities who presents with a chest wall mass on his right side over his ribs. It is unclear how long this mass has been there. The patient does not really complain about this area. He had previous heart surgery as well as lung surgery and had a chest tube through this area. The mass is fairly large and nodular. He presents now for surgical evaluation for excision and diagnosis. 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 He had a PET scan in August 2018 that showed that he may have some chest wall involvement. He is not being treated for mesothelioma.  He had a left axillary abscess that was drained in December and is now healed. He has a similar lump on the upper medial right arm but it is not inflamed.   Past Surgical History (Devin Hurst, [place]; 06/14/2017 11:16 AM) Coronary Artery Bypass Graft Laparoscopic Inguinal Hernia Surgery Right. Lung Surgery Right. Prostate Surgery - Removal  Diagnostic Studies History (Devin Hurst, [place]; 06/14/2017 11:16 AM) Colonoscopy 1-5 years ago  Allergies (Devin Hurst, [place]; 06/14/2017 11:18 AM) No Known Drug Allergies [06/14/2017]: Allergies Reconciled  Medication History (Devin Hurst, RMA; 06/14/2017 11:20 AM) Atorvastatin Calcium (40MG  Tablet, Oral) Active. Furosemide (20MG  Tablet, Oral) Active. Memantine HCl (10MG  Tablet, Oral) Active. TraZODone HCl (100MG  Tablet, Oral) Active. Multi-Vitamin (Oral) Active. Aspirin (81MG  Tablet, Oral) Active. Potassium (Oral) Specific strength unknown - Active. Thiamine HCl (100MG  Tablet, Oral) Active. Lasix (20MG  Tablet, Oral) Active. Medications Reconciled  Social History (Devin Hurst, RMA; 06/14/2017 11:16 AM) Alcohol use  Moderate alcohol use. No caffeine use No drug use Tobacco use Never smoker.  Family History (Devin Hurst, [place]; 06/14/2017 11:16 AM) Heart Disease Brother, Mother.  Other Problems (Devin Hurst, [place]; 06/14/2017 11:16 AM) Chest pain Home Oxygen Use Hypercholesterolemia Inguinal Hernia Kidney Stone Myocardial infarction Prostate Cancer     Review of Systems (Devin A. Brown RMA; 06/14/2017 11:16 AM) General Present- Weight Loss. Not Present- Appetite Loss, Chills, Fatigue, Fever, Night Sweats and Weight Gain. Skin Present- New Lesions. Not Present- Change in Wart/Mole, Dryness, Hives, Jaundice, Non-Healing Wounds, Rash and Ulcer. HEENT Not Present- Earache, Hearing Loss, Hoarseness, Nose Bleed, Oral Ulcers, Ringing in the Ears, Seasonal Allergies, Sinus Pain, Sore Throat, Visual Disturbances, Wears glasses/contact lenses and Yellow Eyes. Breast Not Present- Breast Mass, Breast Pain, Nipple Discharge and Skin Changes. Cardiovascular Present- Shortness of Breath. Not Present- Chest Pain, Difficulty Breathing Lying Down, Leg Cramps, Palpitations, Rapid Heart Rate and Swelling of Extremities. Gastrointestinal Not Present- Abdominal Pain, Bloating, Bloody Stool, Change in Bowel Habits, Chronic diarrhea, Constipation, Difficulty Swallowing, Excessive gas, Gets full quickly at meals, Hemorrhoids, Indigestion, Nausea, Rectal Pain and Vomiting.  Vitals (Devin A. Brown RMA; 06/14/2017 11:18 AM) 06/14/2017 11:17 AM Weight: 116.8 lb Height: 68in Body Surface Area: 1.63 m Body Mass Index: 17.76 kg/m  Temp.: 98.37F  Pulse: 62 (Regular)  BP: 114/76 (Sitting, Left Arm, Standard)      Physical Exam Devin Hurst; 06/14/2017 11:48 AM)  The physical exam findings are as follows: Note:Cachectic appearing in NAD Eyes: Pupils equal, round; sclera anicteric HENT: Oral mucosa moist; good dentition Neck: No masses palpated, no thyromegaly Lungs: CTA  bilaterally; normal respiratory effort; right chest wall anterolaterally -  5 cm firm, nodular subcutaneous mass; mobile; healed chest tube scar over the mass. CV: Regular rate and rhythm; no murmurs; extremities well-perfused with no edema Abd: +bowel sounds, soft, non-tender, no palpable organomegaly; no palpable hernias Skin: Warm, dry; no sign of jaundice Psychiatric - alert and oriented x 4; calm mood and affect    Assessment & Plan Devin Key K. Devin Hurst; 06/14/2017 11:48 AM)  MASS OF CHEST WALL, RIGHT (R22.2)  Current Plans Schedule for Surgery - Excision of right chest wall mass. The surgical procedure has been discussed with the patient. Potential risks, benefits, alternative treatments, and expected outcomes have been explained. All of the patient's questions at this time have been answered. The likelihood of reaching the patient's treatment goal is good. The patient understand the proposed surgical procedure and wishes to proceed. Note:This mass does not feel like a typical lipoma. Based on his recent studies, this is concerning for possible extension of his mesothelioma through the chest wall. However this area seems to be enlarging. I would recommend excision under sedation.  Devin Hurst. Georgette Dover, Hurst, [facility] Surgery  General/ Trauma Surgery  06/14/2017 11:49 AM

## 2017-06-14 NOTE — H&P (View-Only) (Signed)
History of Present Illness Devin Hurst. Ayvin Lipinski MD; 06/14/2017 11:44 AM) The patient is a 84 year old male who presents with a complaint of Mass. Referred by Dorothyann Peng NP  This is an 84 year old male with Alzheimer's and several other medical comorbidities who presents with a chest wall mass on his right side over his ribs. It is unclear how long this mass has been there. The patient does not really complain about this area. He had previous heart surgery as well as lung surgery and had a chest tube through this area. The mass is fairly large and nodular. He presents now for surgical evaluation for excision and diagnosis. 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 He had a PET scan in August 2018 that showed that he may have some chest wall involvement. He is not being treated for mesothelioma.  He had a left axillary abscess that was drained in December and is now healed. He has a similar lump on the upper medial right arm but it is not inflamed.   Past Surgical History ([name], [place]; 06/14/2017 11:16 AM) Coronary Artery Bypass Graft Laparoscopic Inguinal Hernia Surgery Right. Lung Surgery Right. Prostate Surgery - Removal  Diagnostic Studies History ([name], [place]; 06/14/2017 11:16 AM) Colonoscopy 1-5 years ago  Allergies ([name], [place]; 06/14/2017 11:18 AM) No Known Drug Allergies [06/14/2017]: Allergies Reconciled  Medication History ([name], RMA; 06/14/2017 11:20 AM) Atorvastatin Calcium (40MG  Tablet, Oral) Active. Furosemide (20MG  Tablet, Oral) Active. Memantine HCl (10MG  Tablet, Oral) Active. TraZODone HCl (100MG  Tablet, Oral) Active. Multi-Vitamin (Oral) Active. Aspirin (81MG  Tablet, Oral) Active. Potassium (Oral) Specific strength unknown - Active. Thiamine HCl (100MG  Tablet, Oral) Active. Lasix (20MG  Tablet, Oral) Active. Medications Reconciled  Social History ([name], RMA; 06/14/2017 11:16 AM) Alcohol use  Moderate alcohol use. No caffeine use No drug use Tobacco use Never smoker.  Family History ([name], [place]; 06/14/2017 11:16 AM) Heart Disease Brother, Mother.  Other Problems ([name], [place]; 06/14/2017 11:16 AM) Chest pain Home Oxygen Use Hypercholesterolemia Inguinal Hernia Kidney Stone Myocardial infarction Prostate Cancer     Review of Systems ([name]; 06/14/2017 11:16 AM) General Present- Weight Loss. Not Present- Appetite Loss, Chills, Fatigue, Fever, Night Sweats and Weight Gain. Skin Present- New Lesions. Not Present- Change in Wart/Mole, Dryness, Hives, Jaundice, Non-Healing Wounds, Rash and Ulcer. HEENT Not Present- Earache, Hearing Loss, Hoarseness, Nose Bleed, Oral Ulcers, Ringing in the Ears, Seasonal Allergies, Sinus Pain, Sore Throat, Visual Disturbances, Wears glasses/contact lenses and Yellow Eyes. Breast Not Present- Breast Mass, Breast Pain, Nipple Discharge and Skin Changes. Cardiovascular Present- Shortness of Breath. Not Present- Chest Pain, Difficulty Breathing Lying Down, Leg Cramps, Palpitations, Rapid Heart Rate and Swelling of Extremities. Gastrointestinal Not Present- Abdominal Pain, Bloating, Bloody Stool, Change in Bowel Habits, Chronic diarrhea, Constipation, Difficulty Swallowing, Excessive gas, Gets full quickly at meals, Hemorrhoids, Indigestion, Nausea, Rectal Pain and Vomiting.  Vitals ([name]; 06/14/2017 11:18 AM) 06/14/2017 11:17 AM Weight: 116.8 lb Height: 68in Body Surface Area: 1.63 m Body Mass Index: 17.76 kg/m  Temp.: 98.87F  Pulse: 62 (Regular)  BP: 114/76 (Sitting, Left Arm, Standard)      Physical Exam Rodman Key K. Mollie Rossano MD; 06/14/2017 11:48 AM)  The physical exam findings are as follows: Note:Cachectic appearing in NAD Eyes: Pupils equal, round; sclera anicteric HENT: Oral mucosa moist; good dentition Neck: No masses palpated, no thyromegaly Lungs: CTA  bilaterally; normal respiratory effort; right chest wall anterolaterally -  5 cm firm, nodular subcutaneous mass; mobile; healed chest tube scar over the mass. CV: Regular rate and rhythm; no murmurs; extremities well-perfused with no edema Abd: +bowel sounds, soft, non-tender, no palpable organomegaly; no palpable hernias Skin: Warm, dry; no sign of jaundice Psychiatric - alert and oriented x 4; calm mood and affect    Assessment & Plan Rodman Key K. Daisean Brodhead MD; 06/14/2017 11:48 AM)  MASS OF CHEST WALL, RIGHT (R22.2)  Current Plans Schedule for Surgery - Excision of right chest wall mass. The surgical procedure has been discussed with the patient. Potential risks, benefits, alternative treatments, and expected outcomes have been explained. All of the patient's questions at this time have been answered. The likelihood of reaching the patient's treatment goal is good. The patient understand the proposed surgical procedure and wishes to proceed. Note:This mass does not feel like a typical lipoma. Based on his recent studies, this is concerning for possible extension of his mesothelioma through the chest wall. However this area seems to be enlarging. I would recommend excision under sedation.  Devin Hurst. Georgette Dover, MD, [facility]  General/ Trauma Surgery  06/14/2017 11:49 AM

## 2017-06-15 ENCOUNTER — Telehealth: Payer: Self-pay | Admitting: Nurse Practitioner

## 2017-06-15 NOTE — Telephone Encounter (Signed)
   [facility] Pre-operative Risk Assessment    Request for surgical clearance:  1. What type of surgery is being performed? Mass of Chest Wall, Right   2. When is this surgery scheduled? TBD   3. What type of clearance is required (medical clearance vs. Pharmacy clearance to hold med vs. Both)? Medical Clearance and address Aspirin therapy  4. Are there any medications that need to be held prior to surgery and how long? No specific request listed, patient takes Aspirin 81 mg daily   5. Practice name and name of physician performing surgery? [facility], Dr. Georgette Dover   6. What is your office phone and fax number? Phone:  [phone], Fax [phone]   7. Anesthesia type (None, local, MAC, general) ? MAC   Swinyer, Lanice Schwab 06/15/2017, 2:54 PM  _________________________________________________________________   (provider comments below)

## 2017-06-15 NOTE — Telephone Encounter (Signed)
   Primary Cardiologist: Dr. Harrington Challenger  Chart reviewed as part of pre-operative protocol coverage. Because of Devin Hurst's past medical history and time since last visit,  He is have dyspnea. The patient has Alzheimer's disease. Wife says patient has chronic pleural effusion.   Pre-op covering staff: - Please schedule appointment and call patient to inform them. - Please contact requesting surgeon's office via preferred method (i.e, phone, fax) to inform them of need for appointment prior to surgery.  [place], [place]  06/15/2017, 3:32 PM

## 2017-06-15 NOTE — Telephone Encounter (Signed)
Patient has appointment this Friday with [place] PA. [facility] to let them know, and they are aware.

## 2017-06-17 ENCOUNTER — Telehealth: Payer: Self-pay | Admitting: Internal Medicine

## 2017-06-17 ENCOUNTER — Encounter: Payer: Self-pay | Admitting: Physician Assistant

## 2017-06-17 NOTE — Telephone Encounter (Signed)
Returned pts wifes call, Remo Lipps, [place] on file. She was returning call to Guinevere Ferrari in chart prep.  Transferred pt to [facility].
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
## 2017-06-17 NOTE — Telephone Encounter (Signed)
New Message   Patients wife Remo Lipps is calling about email that she was to receive for pre-op clearance that she has not received. Please call to discuss.

## 2017-06-18 ENCOUNTER — Ambulatory Visit (INDEPENDENT_AMBULATORY_CARE_PROVIDER_SITE_OTHER): Payer: Medicare Other | Admitting: Physician Assistant

## 2017-06-18 ENCOUNTER — Encounter: Payer: Self-pay | Admitting: Physician Assistant

## 2017-06-18 VITALS — BP 90/70 | HR 77

## 2017-06-18 DIAGNOSIS — E782 Mixed hyperlipidemia: Secondary | ICD-10-CM | POA: Diagnosis not present

## 2017-06-18 DIAGNOSIS — F0390 Unspecified dementia without behavioral disturbance: Secondary | ICD-10-CM

## 2017-06-18 DIAGNOSIS — Z01818 Encounter for other preprocedural examination: Secondary | ICD-10-CM

## 2017-06-18 DIAGNOSIS — C459 Mesothelioma, unspecified: Secondary | ICD-10-CM | POA: Diagnosis not present

## 2017-06-18 DIAGNOSIS — I2510 Atherosclerotic heart disease of native coronary artery without angina pectoris: Secondary | ICD-10-CM

## 2017-06-18 NOTE — Patient Instructions (Addendum)
Medication Instructions:  Your physician recommends that you continue on your current medications as directed. Please refer to the Current Medication list given to you today.   Labwork: None ordered today  Testing/Procedures: None ordered today  Follow-Up: Your physician wants you to follow-up in 6 months with Dr. Harrington Challenger. You will receive a reminder letter in the mail two months in advance. If you don't receive a letter, please call our office to schedule the follow-up appointment.    Any Other Special Instructions Will Be Listed Below (If Applicable).     If you need a refill on your cardiac medications before your next appointment, please call your pharmacy.

## 2017-06-24 NOTE — Progress Notes (Signed)
Spoke with Dr. Valma Cava regarding patients respiratory history and oxygen dependency. Reviewed Pulmonary function studies and medical history.  This case will be moved to the [facility] OR due to possible need for hospital admission and respiratory support postoperatively.  Hassan Rowan at [facility] made aware.

## 2017-06-26 ENCOUNTER — Encounter (HOSPITAL_COMMUNITY): Payer: Self-pay | Admitting: *Deleted

## 2017-06-26 NOTE — Progress Notes (Signed)
Wife reports that he has chronic shob.

## 2017-06-28 NOTE — Progress Notes (Signed)
As requested by nurse that completed SDW-Pre-op call, spoke with Hassan Rowan to make MD aware that pt spouse stated that MD agreed to remove a sebaceous cyst under the right arm as well (not listed on current consent form).

## 2017-06-28 NOTE — Progress Notes (Signed)
Anesthesia Chart Review:  Pt is a same day work up  Patient is an 84 year old male scheduled for excision of subcutaneous right chest wall mass on 06/19/2017 with Donnie Mesa, MD  - PCP is Dorothyann Peng, NP - Cardiologist is Dorris Carnes, MD.  Cleared for surgery at last office visit 06/18/17 with Leanor Kail, PA  PMH includes:    CAD (cardiac arrest -> s/p CABG 2014), CHF, atrial fibrillation (reportedly 1 time only on echo), heart murmur, hyperlipidemia, nocturnal oxygen desaturation, prostate cancer, Alzheimer's disease.  Never smoker.  Medications include: ASA 81 mg, Lipitor, Lasix, Namenda, metoprolol, potassium, prednisone, Exelon, thiamine  Labs will be obtained day of surgery  CXR 12/25/16: - Right greater than left pleural effusions with apparent pleural-based masses. This likely corresponds to the clinical history of mesothelioma. - Mild left base atelectasis. Right base airspace disease which could represent atelectasis or concurrent infection. - Cardiomegaly with aortic atherosclerosis.  EKG 06/18/17: Sinus rhythm with PACs.  Incomplete RBBB  Echo 06/04/15:  1.  Estimated EF 50%.  LV normal in size and systolic function, with mild LVH.  Unable to assess diastolic function due to atrial fibrillation.  No resting wall motion abnormalities noted. 2.  LA normal in size. 3.  Mildly thickened aortic valve with normal cusps excursion.  No aortic regurgitation or stenosis observed. 4.  Mild mitral valve leaflet thickening/calcification seen.  Mild mitral annular calcification.  Mild mitral regurgitation. 5.  Mild tricuspid regurgitation present. 6.  No pulmonic regurgitation observed. 7.  Large pleural effusion present in the left pleural cavity  If labs acceptable day of surgery, I anticipate pt can proceed with surgery as scheduled.   Willeen Cass, FNP-BC [facility] Short Stay Surgical Center/Anesthesiology Phone: [phone] 06/28/2017 12:22 PM

## 2017-06-29 ENCOUNTER — Other Ambulatory Visit: Payer: Self-pay

## 2017-06-29 ENCOUNTER — Encounter (HOSPITAL_COMMUNITY)
Admission: RE | Disposition: A | Discharge status: Home / Self Care | Payer: Self-pay | Source: Ambulatory Visit | Attending: Surgery

## 2017-06-29 ENCOUNTER — Ambulatory Visit (HOSPITAL_COMMUNITY): Payer: Medicare Other | Admitting: Emergency Medicine

## 2017-06-29 ENCOUNTER — Encounter (HOSPITAL_COMMUNITY): Payer: Self-pay

## 2017-06-29 ENCOUNTER — Observation Stay (HOSPITAL_COMMUNITY)
Admission: RE | Admit: 2017-06-29 | Discharge: 2017-06-30 | Disposition: A | Discharge status: Home / Self Care | Payer: Medicare Other | Source: Ambulatory Visit | Attending: Surgery | Admitting: Surgery

## 2017-06-29 DIAGNOSIS — Z7982 Long term (current) use of aspirin: Secondary | ICD-10-CM | POA: Insufficient documentation

## 2017-06-29 DIAGNOSIS — Z8546 Personal history of malignant neoplasm of prostate: Secondary | ICD-10-CM | POA: Insufficient documentation

## 2017-06-29 DIAGNOSIS — G309 Alzheimer's disease, unspecified: Secondary | ICD-10-CM | POA: Insufficient documentation

## 2017-06-29 DIAGNOSIS — C459 Mesothelioma, unspecified: Principal | ICD-10-CM | POA: Insufficient documentation

## 2017-06-29 DIAGNOSIS — F0280 Dementia in other diseases classified elsewhere without behavioral disturbance: Secondary | ICD-10-CM | POA: Diagnosis not present

## 2017-06-29 DIAGNOSIS — I10 Essential (primary) hypertension: Secondary | ICD-10-CM | POA: Diagnosis not present

## 2017-06-29 DIAGNOSIS — Z79899 Other long term (current) drug therapy: Secondary | ICD-10-CM | POA: Diagnosis not present

## 2017-06-29 DIAGNOSIS — E7800 Pure hypercholesterolemia, unspecified: Secondary | ICD-10-CM | POA: Insufficient documentation

## 2017-06-29 DIAGNOSIS — I252 Old myocardial infarction: Secondary | ICD-10-CM | POA: Diagnosis not present

## 2017-06-29 HISTORY — DX: Dyspnea, unspecified: R06.00

## 2017-06-29 HISTORY — PX: MASS EXCISION: SHX2000

## 2017-06-29 HISTORY — DX: Pneumoconiosis due to asbestos and other mineral fibers: J61

## 2017-06-29 HISTORY — DX: Palpitations: R00.2

## 2017-06-29 HISTORY — DX: Hypotension, unspecified: I95.9

## 2017-06-29 HISTORY — DX: Pneumonia, unspecified organism: J18.9

## 2017-06-29 HISTORY — DX: Personal history of urinary calculi: Z87.442

## 2017-06-29 HISTORY — DX: Personal history of other diseases of the nervous system and sense organs: Z86.69

## 2017-06-29 HISTORY — DX: Heart failure, unspecified: I50.9

## 2017-06-29 HISTORY — DX: Idiopathic sleep related nonobstructive alveolar hypoventilation: G47.34

## 2017-06-29 LAB — CBC
HCT: 41.8 % (ref 39.0–52.0)
Hemoglobin: 13.2 g/dL (ref 13.0–17.0)
MCH: 27.2 pg (ref 26.0–34.0)
MCHC: 31.6 g/dL (ref 30.0–36.0)
MCV: 86 fL (ref 78.0–100.0)
PLATELETS: 210 10*3/uL (ref 150–400)
RBC: 4.86 MIL/uL (ref 4.22–5.81)
RDW: 16.7 % — AB (ref 11.5–15.5)
WBC: 5.7 10*3/uL (ref 4.0–10.5)

## 2017-06-29 LAB — BASIC METABOLIC PANEL
ANION GAP: 10 (ref 5–15)
BUN: 16 mg/dL (ref 6–20)
CO2: 28 mmol/L (ref 22–32)
CREATININE: 0.77 mg/dL (ref 0.61–1.24)
Calcium: 9.1 mg/dL (ref 8.9–10.3)
Chloride: 105 mmol/L (ref 101–111)
GFR calc non Af Amer: >60 mL/min (ref >60)
Glucose, Bld: 92 mg/dL (ref 65–99)
POTASSIUM: 3.8 mmol/L (ref 3.5–5.1)
SODIUM: 143 mmol/L (ref 135–145)

## 2017-06-29 SURGERY — EXCISION MASS
Anesthesia: General | Site: Chest | Laterality: Right

## 2017-06-29 MED ORDER — ONDANSETRON HCL 4 MG/2ML IJ SOLN: 4.0000 mg | Freq: Four times a day (QID) | INTRAMUSCULAR | Status: DC | PRN

## 2017-06-29 MED ORDER — ONDANSETRON HCL 4 MG/2ML IJ SOLN
INTRAMUSCULAR | Status: AC
  Filled 2017-06-29: qty 2

## 2017-06-29 MED ORDER — PREDNISONE 5 MG PO TABS
2.5000 mg | ORAL_TABLET | ORAL | Status: DC
  Administered 2017-06-29: 2.5 mg via ORAL
  Filled 2017-06-29: qty 1

## 2017-06-29 MED ORDER — EPHEDRINE 5 MG/ML INJ
INTRAVENOUS | Status: AC
  Filled 2017-06-29: qty 10

## 2017-06-29 MED ORDER — PROPOFOL 10 MG/ML IV BOLUS
INTRAVENOUS | Status: DC | PRN
  Administered 2017-06-29: 100 mg via INTRAVENOUS

## 2017-06-29 MED ORDER — ACETAMINOPHEN 650 MG RE SUPP: 650.0000 mg | Freq: Four times a day (QID) | RECTAL | Status: DC | PRN

## 2017-06-29 MED ORDER — MEPERIDINE HCL 50 MG/ML IJ SOLN: 6.2500 mg | INTRAMUSCULAR | Status: DC | PRN

## 2017-06-29 MED ORDER — LIDOCAINE HCL (CARDIAC) 20 MG/ML IV SOLN
INTRAVENOUS | Status: DC | PRN
  Administered 2017-06-29: 100 mg via INTRAVENOUS

## 2017-06-29 MED ORDER — DEXAMETHASONE SODIUM PHOSPHATE 10 MG/ML IJ SOLN
INTRAMUSCULAR | Status: AC
  Filled 2017-06-29: qty 1

## 2017-06-29 MED ORDER — METOPROLOL SUCCINATE ER 25 MG PO TB24
25.0000 mg | ORAL_TABLET | Freq: Every day | ORAL | Status: DC
  Administered 2017-06-30: 25 mg via ORAL
  Filled 2017-06-29: qty 1

## 2017-06-29 MED ORDER — OXYCODONE HCL 5 MG PO TABS: 5.0000 mg | ORAL_TABLET | ORAL | Status: DC | PRN

## 2017-06-29 MED ORDER — CHLORHEXIDINE GLUCONATE CLOTH 2 % EX PADS: 6.0000 | MEDICATED_PAD | Freq: Once | CUTANEOUS | Status: DC

## 2017-06-29 MED ORDER — BUPIVACAINE-EPINEPHRINE (PF) 0.5% -1:200000 IJ SOLN
INTRAMUSCULAR | Status: AC
  Filled 2017-06-29: qty 30

## 2017-06-29 MED ORDER — FENTANYL CITRATE (PF) 100 MCG/2ML IJ SOLN: 25.0000 ug | INTRAMUSCULAR | Status: DC | PRN

## 2017-06-29 MED ORDER — FUROSEMIDE 20 MG PO TABS
20.0000 mg | ORAL_TABLET | ORAL | Status: DC
  Administered 2017-06-30: 20 mg via ORAL
  Filled 2017-06-29: qty 1

## 2017-06-29 MED ORDER — DEXAMETHASONE SODIUM PHOSPHATE 10 MG/ML IJ SOLN
INTRAMUSCULAR | Status: DC | PRN
  Administered 2017-06-29: 5 mg via INTRAVENOUS

## 2017-06-29 MED ORDER — KETAMINE HCL-SODIUM CHLORIDE 100-0.9 MG/10ML-% IV SOSY
PREFILLED_SYRINGE | INTRAVENOUS | Status: AC
  Filled 2017-06-29: qty 10

## 2017-06-29 MED ORDER — MORPHINE SULFATE (PF) 4 MG/ML IV SOLN: 2.0000 mg | INTRAVENOUS | Status: DC | PRN

## 2017-06-29 MED ORDER — FENTANYL CITRATE (PF) 250 MCG/5ML IJ SOLN
INTRAMUSCULAR | Status: AC
  Filled 2017-06-29: qty 5

## 2017-06-29 MED ORDER — RIVASTIGMINE 9.5 MG/24HR TD PT24
9.5000 mg | MEDICATED_PATCH | Freq: Every day | TRANSDERMAL | Status: DC
  Administered 2017-06-29 – 2017-06-30 (×2): 9.5 mg via TRANSDERMAL
  Filled 2017-06-29 (×2): qty 1

## 2017-06-29 MED ORDER — LIDOCAINE 2% (20 MG/ML) 5 ML SYRINGE
INTRAMUSCULAR | Status: AC
  Filled 2017-06-29: qty 5

## 2017-06-29 MED ORDER — CEFAZOLIN SODIUM-DEXTROSE 2-4 GM/100ML-% IV SOLN
2.0000 g | INTRAVENOUS | Status: AC
  Administered 2017-06-29: 2 g via INTRAVENOUS

## 2017-06-29 MED ORDER — CEFAZOLIN SODIUM-DEXTROSE 2-4 GM/100ML-% IV SOLN
INTRAVENOUS | Status: AC
  Filled 2017-06-29: qty 100

## 2017-06-29 MED ORDER — BUPIVACAINE-EPINEPHRINE 0.5% -1:200000 IJ SOLN
INTRAMUSCULAR | Status: DC | PRN
  Administered 2017-06-29: 14 mL

## 2017-06-29 MED ORDER — TRAZODONE HCL 100 MG PO TABS: 100.0000 mg | ORAL_TABLET | Freq: Every evening | ORAL | Status: DC | PRN

## 2017-06-29 MED ORDER — PHENYLEPHRINE 40 MCG/ML (10ML) SYRINGE FOR IV PUSH (FOR BLOOD PRESSURE SUPPORT)
PREFILLED_SYRINGE | INTRAVENOUS | Status: DC | PRN
  Administered 2017-06-29 (×13): 80 ug via INTRAVENOUS

## 2017-06-29 MED ORDER — ATORVASTATIN CALCIUM 40 MG PO TABS
40.0000 mg | ORAL_TABLET | Freq: Every day | ORAL | Status: DC
  Administered 2017-06-29: 40 mg via ORAL
  Filled 2017-06-29: qty 1

## 2017-06-29 MED ORDER — 0.9 % SODIUM CHLORIDE (POUR BTL) OPTIME
TOPICAL | Status: DC | PRN
  Administered 2017-06-29: 1000 mL

## 2017-06-29 MED ORDER — SODIUM CHLORIDE 0.9 % IV SOLN: INTRAVENOUS | Status: DC

## 2017-06-29 MED ORDER — PHENYLEPHRINE 40 MCG/ML (10ML) SYRINGE FOR IV PUSH (FOR BLOOD PRESSURE SUPPORT)
PREFILLED_SYRINGE | INTRAVENOUS | Status: AC
  Filled 2017-06-29: qty 10

## 2017-06-29 MED ORDER — ONDANSETRON HCL 4 MG/2ML IJ SOLN
INTRAMUSCULAR | Status: DC | PRN
  Administered 2017-06-29: 4 mg via INTRAVENOUS

## 2017-06-29 MED ORDER — HYDROCODONE-ACETAMINOPHEN 5-325 MG PO TABS: 1.0000 | ORAL_TABLET | Freq: Four times a day (QID) | ORAL | 0 refills | Status: DC | PRN

## 2017-06-29 MED ORDER — ACETAMINOPHEN 325 MG PO TABS: 650.0000 mg | ORAL_TABLET | Freq: Four times a day (QID) | ORAL | Status: DC | PRN

## 2017-06-29 MED ORDER — ONDANSETRON 4 MG PO TBDP: 4.0000 mg | ORAL_TABLET | Freq: Four times a day (QID) | ORAL | Status: DC | PRN

## 2017-06-29 MED ORDER — MIDAZOLAM HCL 2 MG/2ML IJ SOLN
INTRAMUSCULAR | Status: AC
  Filled 2017-06-29: qty 2

## 2017-06-29 MED ORDER — ENOXAPARIN SODIUM 40 MG/0.4ML ~~LOC~~ SOLN
40.0000 mg | SUBCUTANEOUS | Status: DC
  Filled 2017-06-29: qty 0.4

## 2017-06-29 MED ORDER — EPHEDRINE SULFATE-NACL 50-0.9 MG/10ML-% IV SOSY
PREFILLED_SYRINGE | INTRAVENOUS | Status: DC | PRN
  Administered 2017-06-29 (×13): 10 mg via INTRAVENOUS

## 2017-06-29 MED ORDER — POTASSIUM CHLORIDE CRYS ER 10 MEQ PO TBCR
10.0000 meq | EXTENDED_RELEASE_TABLET | ORAL | Status: DC
  Administered 2017-06-30: 10 meq via ORAL
  Filled 2017-06-29: qty 1

## 2017-06-29 MED ORDER — MEMANTINE HCL 10 MG PO TABS
10.0000 mg | ORAL_TABLET | Freq: Two times a day (BID) | ORAL | Status: DC
  Administered 2017-06-29 – 2017-06-30 (×2): 10 mg via ORAL
  Filled 2017-06-29 (×2): qty 1

## 2017-06-29 MED ORDER — FENTANYL CITRATE (PF) 100 MCG/2ML IJ SOLN
INTRAMUSCULAR | Status: DC | PRN
  Administered 2017-06-29 (×2): 25 ug via INTRAVENOUS

## 2017-06-29 MED ORDER — LACTATED RINGERS IV SOLN
INTRAVENOUS | Status: DC | PRN
  Administered 2017-06-29: 12:00:00 via INTRAVENOUS

## 2017-06-29 SURGICAL SUPPLY — 54 items
BENZOIN TINCTURE PRP APPL 2/3 (GAUZE/BANDAGES/DRESSINGS) ×4 IMPLANT
BLADE CLIPPER SURG (BLADE) IMPLANT
BLADE SURG 10 STRL SS (BLADE) IMPLANT
BLADE SURG 15 STRL LF DISP TIS (BLADE) ×2 IMPLANT
BLADE SURG 15 STRL SS (BLADE) ×2
CHLORAPREP W/TINT 26ML (MISCELLANEOUS) ×4 IMPLANT
CLEANER TIP ELECTROSURG 2X2 (MISCELLANEOUS) IMPLANT
CLOSURE WOUND 1/2 X4 (GAUZE/BANDAGES/DRESSINGS) ×1
CONT SPEC 4OZ CLIKSEAL STRL BL (MISCELLANEOUS) IMPLANT
COVER SURGICAL LIGHT HANDLE (MISCELLANEOUS) ×4 IMPLANT
DECANTER SPIKE VIAL GLASS SM (MISCELLANEOUS) IMPLANT
DRAPE LAPAROTOMY 100X72 PEDS (DRAPES) ×4 IMPLANT
DRAPE UTILITY XL STRL (DRAPES) ×8 IMPLANT
DRSG TEGADERM 4X4.75 (GAUZE/BANDAGES/DRESSINGS) ×4 IMPLANT
ELECT CAUTERY BLADE 6.4 (BLADE) ×4 IMPLANT
ELECT REM PT RETURN 9FT ADLT (ELECTROSURGICAL) ×4
ELECTRODE REM PT RTRN 9FT ADLT (ELECTROSURGICAL) ×2 IMPLANT
GAUZE SPONGE 4X4 12PLY STRL (GAUZE/BANDAGES/DRESSINGS) ×4 IMPLANT
GAUZE SPONGE 4X4 12PLY STRL LF (GAUZE/BANDAGES/DRESSINGS) ×4 IMPLANT
GLOVE BIO SURGEON STRL SZ7 (GLOVE) ×4 IMPLANT
GLOVE BIO SURGEON STRL SZ7.5 (GLOVE) ×4 IMPLANT
GLOVE BIOGEL PI IND STRL 6 (GLOVE) ×4 IMPLANT
GLOVE BIOGEL PI IND STRL 7.5 (GLOVE) ×4 IMPLANT
GLOVE BIOGEL PI INDICATOR 6 (GLOVE) ×4
GLOVE BIOGEL PI INDICATOR 7.5 (GLOVE) ×4
GLOVE SURG SS PI 6.0 STRL IVOR (GLOVE) ×8 IMPLANT
GOWN STRL REUS W/ TWL LRG LVL3 (GOWN DISPOSABLE) ×8 IMPLANT
GOWN STRL REUS W/TWL LRG LVL3 (GOWN DISPOSABLE) ×8
KIT BASIN OR (CUSTOM PROCEDURE TRAY) ×4 IMPLANT
KIT MARKER MARGIN INK (KITS) ×4 IMPLANT
KIT ROOM TURNOVER OR (KITS) ×4 IMPLANT
NEEDLE HYPO 25GX1X1/2 BEV (NEEDLE) ×4 IMPLANT
NS IRRIG 1000ML POUR BTL (IV SOLUTION) ×4 IMPLANT
PACK SURGICAL SETUP 50X90 (CUSTOM PROCEDURE TRAY) ×4 IMPLANT
PAD ABD 8X10 STRL (GAUZE/BANDAGES/DRESSINGS) ×4 IMPLANT
PAD ARMBOARD 7.5X6 YLW CONV (MISCELLANEOUS) ×8 IMPLANT
PENCIL BUTTON HOLSTER BLD 10FT (ELECTRODE) ×4 IMPLANT
SPECIMEN JAR SMALL (MISCELLANEOUS) IMPLANT
SPONGE LAP 18X18 X RAY DECT (DISPOSABLE) ×4 IMPLANT
SPONGE LAP 4X18 X RAY DECT (DISPOSABLE) IMPLANT
STRIP CLOSURE SKIN 1/2X4 (GAUZE/BANDAGES/DRESSINGS) ×3 IMPLANT
SUT MNCRL AB 4-0 PS2 18 (SUTURE) ×4 IMPLANT
SUT VIC AB 2-0 SH 27 (SUTURE)
SUT VIC AB 2-0 SH 27X BRD (SUTURE) IMPLANT
SUT VIC AB 3-0 SH 27 (SUTURE) ×2
SUT VIC AB 3-0 SH 27XBRD (SUTURE) ×2 IMPLANT
SYR BULB IRRIGATION 50ML (SYRINGE) ×4 IMPLANT
SYR CONTROL 10ML LL (SYRINGE) ×4 IMPLANT
TAPE CLOTH SURG 4X10 WHT LF (GAUZE/BANDAGES/DRESSINGS) ×4 IMPLANT
TOWEL OR 17X24 6PK STRL BLUE (TOWEL DISPOSABLE) ×4 IMPLANT
TOWEL OR 17X26 10 PK STRL BLUE (TOWEL DISPOSABLE) ×4 IMPLANT
TUBE CONNECTING 12'X1/4 (SUCTIONS) ×1
TUBE CONNECTING 12X1/4 (SUCTIONS) ×3 IMPLANT
YANKAUER SUCT BULB TIP NO VENT (SUCTIONS) IMPLANT

## 2017-06-29 NOTE — Anesthesia Procedure Notes (Addendum)
Procedure Name: Intubation Date/Time: 06/29/2017 12:32 PM Performed by: Gwyndolyn Saxon, CRNA Pre-anesthesia Checklist: Patient identified, Emergency Drugs available, Suction available and Patient being monitored Patient Re-evaluated:Patient Re-evaluated prior to induction Oxygen Delivery Method: Circle system utilized Preoxygenation: Pre-oxygenation with 100% oxygen Laryngoscope Size: Miller and 3 Grade View: Grade I Tube type: Oral Tube size: 7.0 mm Number of attempts: 1 Airway Equipment and Method: Stylet Placement Confirmation: ETT inserted through vocal cords under direct vision,  positive ETCO2,  CO2 detector and breath sounds checked- equal and bilateral Secured at: 23 cm Tube secured with: Tape Dental Injury: Teeth and Oropharynx as per pre-operative assessment  Comments: Leakage resolved, airway secured, case continued ([place])

## 2017-06-29 NOTE — Progress Notes (Signed)
Received patient from PACU post excision of subcutaneous right chest wall mass. Alert, oriented to name and place. Not in any distress. VSS. Oriented to staff and unit. Wife at bedside and oriented to unit and staff as well. Dressing to right chest intact. Will endorse appropriately.

## 2017-06-29 NOTE — Transfer of Care (Signed)
Immediate Anesthesia Transfer of Care Note  Patient: Devin Hurst  Procedure(s) Performed: EXCISION OF SUBCUTANEOUS RIGHT CHEST WALL MASS (Right Chest)  Patient Location: PACU  Anesthesia Type:General  Level of Consciousness: drowsy  Airway & Oxygen Therapy: Patient Spontanous Breathing and Patient connected to face mask oxygen  Post-op Assessment: Report given to RN and Post -op Vital signs reviewed and stable  Post vital signs: Reviewed and stable  Last Vitals:  Vitals:   06/29/17 0954  BP: (!) 159/92  Pulse: 67  Resp: 18  Temp: (!) 36.3 C  SpO2: 94%    Last Pain:  Vitals:   06/29/17 0954  TempSrc: Oral      Patients Stated Pain Goal: 0 (34/03/52 4818)  Complications: No apparent anesthesia complications

## 2017-06-29 NOTE — Interval H&P Note (Signed)
History and Physical Interval Note:  06/29/2017 10:18 AM  Devin Hurst  has presented today for surgery, with the diagnosis of Right chest wall subcutaneous mass 5 cm, RIGHT ARM SEBACEOUS CYST  The various methods of treatment have been discussed with the patient and family. After consideration of risks, benefits and other options for treatment, the patient has consented to  Procedure(s): EXCISION OF SUBCUTANEOUS RIGHT CHEST WALL MASS eraS PATHWAY (Right) REMOVAL OF SEBACEOUS CYST RIGHT ARM (Right) as a surgical intervention .  The patient's history has been reviewed, patient examined, no change in status, stable for surgery.  I have reviewed the patient's chart and labs.  Questions were answered to the patient's satisfaction.     [name]

## 2017-06-29 NOTE — Anesthesia Procedure Notes (Signed)
Procedure Name: LMA Insertion Date/Time: 06/29/2017 12:21 PM Performed by: Gwyndolyn Saxon, CRNA Pre-anesthesia Checklist: Patient identified, Emergency Drugs available, Suction available, Patient being monitored and Timeout performed Patient Re-evaluated:Patient Re-evaluated prior to induction Oxygen Delivery Method: Circle system utilized Preoxygenation: Pre-oxygenation with 100% oxygen Induction Type: IV induction Ventilation: Mask ventilation without difficulty LMA: LMA inserted LMA Size: 3.0 Number of attempts: 1 Placement Confirmation: positive ETCO2,  CO2 detector and breath sounds checked- equal and bilateral Tube secured with: Tape Dental Injury: Teeth and Oropharynx as per pre-operative assessment  Comments: Due to leakage not ressolved, see next LMA insertion note Shara Blazing)

## 2017-06-29 NOTE — Anesthesia Procedure Notes (Signed)
Procedure Name: LMA Insertion Date/Time: 06/29/2017 12:25 PM Performed by: Gwyndolyn Saxon, CRNA Pre-anesthesia Checklist: Patient identified, Emergency Drugs available, Suction available, Patient being monitored and Timeout performed Patient Re-evaluated:Patient Re-evaluated prior to induction Oxygen Delivery Method: Circle system utilized Preoxygenation: Pre-oxygenation with 100% oxygen LMA: LMA inserted LMA Size: 4.0 Number of attempts: 1 Placement Confirmation: positive ETCO2,  CO2 detector and breath sounds checked- equal and bilateral Tube secured with: Tape Dental Injury: Teeth and Oropharynx as per pre-operative assessment  Comments: Due to leakage not resolved, initiated call to MDA to intubate with ETT.(John Everitt [place])

## 2017-06-29 NOTE — Discharge Instructions (Signed)
[facility] Office Phone Number [phone]   POST OP INSTRUCTIONS  Always review your discharge instruction sheet given to you by the facility where your surgery was performed.  IF YOU HAVE DISABILITY OR FAMILY LEAVE FORMS, YOU MUST BRING THEM TO THE OFFICE FOR PROCESSING.  DO NOT GIVE THEM TO YOUR DOCTOR.  1. A prescription for pain medication may be given to you upon discharge.  Take your pain medication as prescribed, if needed.  If narcotic pain medicine is not needed, then you may take acetaminophen (Tylenol) or ibuprofen (Advil) as needed. 2. Take your usually prescribed medications unless otherwise directed 3. If you need a refill on your pain medication, please contact your pharmacy.  They will contact our office to request authorization.  Prescriptions will not be filled after 5pm or on week-ends. 4. You should eat very light the first 24 hours after surgery, such as soup, crackers, pudding, etc.  Resume your normal diet the day after surgery. 5. Most patients will experience some swelling and bruising around the surgical site.  Ice packs will help.  Swelling and bruising can take several days to resolve.  6. It is common to experience some constipation if taking pain medication after surgery.  Increasing fluid intake and taking a stool softener will usually help or prevent this problem from occurring.  A mild laxative (Milk of Magnesia or Miralax) should be taken according to package directions if there are no bowel movements after 48 hours. 7. You may remove your bandages 48 hours after surgery, and you may shower at that time.  You will have steri-strips (small skin tapes) in place directly over the incision.  These strips should be left on the skin for 7-10 days.   8. ACTIVITIES:  You may resume regular daily activities (gradually increasing) beginning the next day.   You may have sexual intercourse when it is comfortable. a. You may drive when you no longer are taking  prescription pain medication, you can comfortably wear a seatbelt, and you can safely maneuver your car and apply brakes. b. RETURN TO WORK:  1-2 weeks 9. You should see your doctor in the office for a follow-up appointment approximately two to three weeks after your surgery.    WHEN TO CALL YOUR DOCTOR: 1. Fever over 101.0 2. Nausea and/or vomiting. 3. Extreme swelling or bruising. 4. Continued bleeding from incision. 5. Increased pain, redness, or drainage from the incision.  The clinic staff is available to answer your questions during regular business hours.  Please don't hesitate to call and ask to speak to one of the nurses for clinical concerns.  If you have a medical emergency, go to the nearest emergency room or call 911.  A surgeon from [facility] is always on call at the hospital.  For further questions, please visit [email or web address]    

## 2017-06-29 NOTE — Anesthesia Preprocedure Evaluation (Addendum)
Anesthesia Evaluation  Patient identified by MRN, date of birth, ID band Patient awake    Reviewed: Allergy & Precautions, NPO status , Patient's Chart, lab work & pertinent test results  Airway Mallampati: II  TM Distance: >3 FB Neck ROM: Full    Dental no notable dental hx.    Pulmonary neg pulmonary ROS,    Pulmonary exam normal breath sounds clear to auscultation       Cardiovascular hypertension, Pt. on medications and Pt. on home beta blockers negative cardio ROS Normal cardiovascular exam Rhythm:Regular Rate:Normal   EKG 06/18/17: Sinus rhythm with PACs.  Incomplete RBBB  Echo 06/04/15:  1.  Estimated EF 50%.  LV normal in size and systolic function, with mild LVH.  Unable to assess diastolic function due to atrial fibrillation.  No resting wall motion abnormalities noted. 2.  LA normal in size. 3.  Mildly thickened aortic valve with normal cusps excursion.  No aortic regurgitation or stenosis observed. 4.  Mild mitral valve leaflet thickening/calcification seen.  Mild mitral annular calcification.  Mild mitral regurgitation. 5.  Mild tricuspid regurgitation present. 6.  No pulmonic regurgitation observed. 7.  Large pleural effusion present in the left pleural cavity      Neuro/Psych negative neurological ROS  negative psych ROS   GI/Hepatic negative GI ROS, Neg liver ROS,   Endo/Other  negative endocrine ROS  Renal/GU negative Renal ROS  negative genitourinary   Musculoskeletal negative musculoskeletal ROS (+)   Abdominal   Peds negative pediatric ROS (+)  Hematology negative hematology ROS (+)   Anesthesia Other Findings   Reproductive/Obstetrics negative OB ROS                             Anesthesia Physical Anesthesia Plan  ASA: III  Anesthesia Plan: General   Post-op Pain Management:    Induction: Intravenous  PONV Risk Score and Plan: 1 and Treatment may vary  due to age or medical condition  Airway Management Planned: LMA  Additional Equipment:   Intra-op Plan:   Post-operative Plan: Extubation in OR  Informed Consent: I have reviewed the patients History and Physical, chart, labs and discussed the procedure including the risks, benefits and alternatives for the proposed anesthesia with the patient or authorized representative who has indicated his/her understanding and acceptance.   Dental Advisory Given  Plan Discussed with: CRNA and Anesthesiologist  Anesthesia Plan Comments: (Labs checked- platelets confirmed with RN in room. Fetal heart tracing, per RN, reported to be stable enough for sitting procedure. Discussed epidural, and patient consents to the procedure:  included risk of possible headache,backache, failed block, allergic reaction, and nerve injury. This patient was asked if she had any questions or concerns before the procedure started.)       Anesthesia Quick Evaluation

## 2017-06-29 NOTE — Op Note (Signed)
Preop diagnosis: Right chest wall mass Postop diagnosis: Same Procedure performed: Excision of subcutaneous right chest wall mass (3 x 4 cm) Surgeon: Donnie Mesa, MD Anesthesia: General endotracheal Indications:This is an 84 year old male with Alzheimer's and several other medical comorbidities who presents with a chest wall mass on his right side over his ribs. It is unclear how long this mass has been there. The patient does not really complain about this area. He had previous heart surgery as well as lung surgery and had a chest tube through this area. The mass is fairly large and nodular. He presents now for surgical evaluation for excision and diagnosis. 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 He had a PET scan in August 2018 that showed that he may have some chest wall involvement. He is not being treated for mesothelioma.  Description of procedure: The patient is brought to the operating room and placed in the supine position on the operating room table.  After an adequate level of general anesthesia was obtained, his right chest wall was prepped with ChloraPrep and draped in sterile fashion.  A timeout was taken to ensure the proper patient and proper procedure.  We infiltrated the area over the mass with 0.5% Marcaine with epinephrine.  I made a transverse incision across the center of the mass.  We dissected down through the dermis.  We used cautery to dissect completely around the anterior surface of the mass.  There are some protruding nodules that were retained with the main specimen.  We then dissected the posterior surface of the mass off of the underlying muscle.  This does not seem to track into the pleural space.  The intercostal muscle seem to be intact.  No other masses are left behind.  The specimen is oriented with the pink kit and sent for pathologic examination.  The wound is irrigated and inspected for hemostasis.  We closed the wound with a deep layer of 3-0 Vicryl and  a subcuticular layer 4-0 Monocryl.  Benzoin Steri-Strips were applied.  The patient was then extubated and brought to the recovery room in stable condition.  All sponge, instrument, and needle counts are correct.  Imogene Burn. Georgette Dover, MD, [facility]  General/ Trauma Surgery  06/29/2017 1:15 PM

## 2017-06-30 ENCOUNTER — Encounter (HOSPITAL_COMMUNITY): Payer: Self-pay | Admitting: Surgery

## 2017-06-30 DIAGNOSIS — C459 Mesothelioma, unspecified: Secondary | ICD-10-CM | POA: Diagnosis not present

## 2017-06-30 MED ORDER — IPRATROPIUM-ALBUTEROL 0.5-2.5 (3) MG/3ML IN SOLN
RESPIRATORY_TRACT | Status: AC
  Filled 2017-06-30: qty 3

## 2017-06-30 NOTE — Discharge Summary (Signed)
Physician Discharge Summary  Patient ID: Devin Hurst MRN: [id number] DOB/AGE: 07-16-1933 84 y.o.  Admit date: 06/29/2017 Discharge date: 06/30/2017  Admission Diagnoses:  Mesothelioma     Chronic respiratory failure with hypoxia     Alzheimer's disease      Right chest wall mass  Discharge Diagnoses: same Active Problems:   Mesothelioma [facility])   Discharged Condition: good  Hospital Course: Underwent excision of right chest wall mass on 06/29/17.  Anesthesia had concerns because of oxygen sats in the mid-80s on room air, so he was kept overnight.  Very comfortable - no complaints.  97% on 2L nasal cannula.  Drops to upper 80's on room air.  Family has home oxygen and states that he is often in the upper 80's until he puts his oxygen back on.  Consults: None    Treatments: surgery: excision of right chest wall mass   Discharge Exam: Blood pressure (!) 104/54, pulse 76, temperature 98.3 F (36.8 C), temperature source Oral, resp. rate 18, height 5\' 8"  (1.727 m), weight 52.6 kg (116 lb), SpO2 97 %. General appearance: alert, cooperative and no distress Resp: normal work of breathing; no accessory muscles Chest wall: mild right chest wall tenderness - dressing clean and dry  Disposition: Final discharge disposition not confirmed  Discharge Instructions    Call MD for:  persistant nausea and vomiting   Complete by:  As directed    Call MD for:  persistant nausea and vomiting   Complete by:  As directed    Call MD for:  redness, tenderness, or signs of infection (pain, swelling, redness, odor or green/yellow discharge around incision site)   Complete by:  As directed    Call MD for:  redness, tenderness, or signs of infection (pain, swelling, redness, odor or green/yellow discharge around incision site)   Complete by:  As directed    Call MD for:  severe uncontrolled pain   Complete by:  As directed    Call MD for:  severe uncontrolled pain   Complete by:  As directed    Call MD for:  temperature >100.4   Complete by:  As directed    Call MD for:  temperature >100.4   Complete by:  As directed    Diet general   Complete by:  As directed    Diet general   Complete by:  As directed    Driving Restrictions   Complete by:  As directed    Do not drive while taking pain medications   Driving Restrictions   Complete by:  As directed    Do not drive while taking pain medications   Increase activity slowly   Complete by:  As directed    Increase activity slowly   Complete by:  As directed    May shower / Bathe   Complete by:  As directed    May shower / Bathe   Complete by:  As directed      Allergies as of 06/30/2017      Reactions   Erythromycin Nausea And Vomiting      Medication List    TAKE these medications   aspirin EC 81 MG tablet Take 81 mg by mouth daily.   atorvastatin 40 MG tablet Commonly known as:  LIPITOR Take 1 tablet (40 mg total) by mouth at bedtime.   furosemide 20 MG tablet Commonly known as:  LASIX Take 20 mg by mouth every Monday, Wednesday, and Friday. In the morning.   HYDROcodone-acetaminophen  5-325 MG tablet Commonly known as:  NORCO/VICODIN Take 1 tablet by mouth every 6 (six) hours as needed for moderate pain.   MACULAR VITAMIN BENEFIT Tabs Take 1 tablet by mouth daily.   memantine 10 MG tablet Commonly known as:  NAMENDA Take 1 tablet (10 mg total) by mouth 2 (two) times daily.   metoprolol succinate 25 MG 24 hr tablet Commonly known as:  TOPROL-XL Take 1 tablet (25 mg total) by mouth daily.   potassium chloride 10 MEQ tablet Commonly known as:  K-DUR,KLOR-CON Take 10 mEq by mouth every Monday, Wednesday, and Friday. In the morning.   predniSONE 2.5 MG tablet Commonly known as:  DELTASONE Take 2.5 mg by mouth every other day. In the morning.   rivastigmine 9.5 mg/24hr Commonly known as:  EXELON Place 1 patch (9.5 mg total) onto the skin daily.   thiamine 100 MG tablet Take 100 mg by mouth  daily.   traZODone 100 MG tablet Commonly known as:  DESYREL Take 100 mg by mouth at bedtime as needed for sleep.   Vitamin D3 5000 units Caps Take 5,000 Units by mouth daily.      Follow-up Information    Donnie Mesa, MD. Schedule an appointment as soon as possible for a visit in 2 week(s).   Specialty:  General Surgery Contact information: [address] [place] [address] [phone]           Signed: [name] 06/30/2017, 7:13 AM

## 2017-06-30 NOTE — Anesthesia Postprocedure Evaluation (Signed)
Anesthesia Post Note  Patient: Devin Hurst, Devin Hurst  Procedure(s) Performed: EXCISION OF SUBCUTANEOUS RIGHT CHEST WALL MASS (Right Chest)     Patient location during evaluation: PACU Anesthesia Type: MAC Level of consciousness: awake and alert Pain management: pain level controlled Vital Signs Assessment: post-procedure vital signs reviewed and stable Respiratory status: spontaneous breathing, nonlabored ventilation, respiratory function stable and patient connected to nasal cannula oxygen Cardiovascular status: stable and blood pressure returned to baseline Postop Assessment: no apparent nausea or vomiting Anesthetic complications: no    Last Vitals:  Vitals:   06/30/17 0118 06/30/17 0410  BP: 105/76 (!) 104/54  Pulse: 80 76  Resp: 18 18  Temp: 37.1 C 36.8 C  SpO2: 94% 97%    Last Pain:  Vitals:   06/30/17 0410  TempSrc: Oral  PainSc:                  Miriah Maruyama

## 2017-06-30 NOTE — Progress Notes (Signed)
Reviewed discharge instruction with the Pt, his wife and son.  Pt wife and son did not have any questions.  Wife instructed to pick up prescription at pharmacy and Pt to take pain medication as needed.  Wife instructed to call for f/u appointment. Pt left ambulatory with his wife and son, with the discharge instructions.

## 2017-07-01 ENCOUNTER — Encounter (HOSPITAL_COMMUNITY): Payer: Self-pay | Admitting: Surgery

## 2017-07-01 NOTE — Addendum Note (Signed)
Addendum  created 07/01/17 1211 by Janeece Riggers, MD   Sign clinical note, SmartForm saved

## 2017-07-01 NOTE — Anesthesia Postprocedure Evaluation (Signed)
Anesthesia Post Note  Patient: Engineer, building services  Procedure(s) Performed: EXCISION OF SUBCUTANEOUS RIGHT CHEST WALL MASS (Right Chest)     Patient location during evaluation: PACU Anesthesia Type: General Level of consciousness: awake and alert Pain management: pain level controlled Vital Signs Assessment: post-procedure vital signs reviewed and stable Respiratory status: spontaneous breathing, nonlabored ventilation, respiratory function stable and patient connected to nasal cannula oxygen Cardiovascular status: blood pressure returned to baseline and stable Postop Assessment: no apparent nausea or vomiting Anesthetic complications: no    Last Vitals:  Vitals:   06/30/17 0947 06/30/17 1023  BP: 104/64 (!) 102/55  Pulse: 84 66  Resp:    Temp:  36.8 C  SpO2:  94%    Last Pain:  Vitals:   06/30/17 1023  TempSrc: Oral  PainSc:                  Abreanna Drawdy

## 2017-07-02 ENCOUNTER — Ambulatory Visit (INDEPENDENT_AMBULATORY_CARE_PROVIDER_SITE_OTHER): Payer: Medicare Other | Admitting: Adult Health

## 2017-07-02 ENCOUNTER — Ambulatory Visit: Payer: Self-pay | Admitting: *Deleted

## 2017-07-02 ENCOUNTER — Encounter: Payer: Self-pay | Admitting: Adult Health

## 2017-07-02 VITALS — BP 148/84 | Temp 97.5°F | Wt 119.0 lb

## 2017-07-02 DIAGNOSIS — K625 Hemorrhage of anus and rectum: Secondary | ICD-10-CM | POA: Diagnosis not present

## 2017-07-02 DIAGNOSIS — I2510 Atherosclerotic heart disease of native coronary artery without angina pectoris: Secondary | ICD-10-CM | POA: Diagnosis not present

## 2017-07-02 NOTE — Progress Notes (Signed)
Subjective:    Patient ID: Devin Hurst, male    DOB: [birth year], 84 y.o.   MRN: [id number]  HPI  84 year old male who  has a past medical history of Alzheimer disease, Anoxic brain injury ([place]), Asbestosis ([place]), Cardiac arrest ([place]), CHF (congestive heart failure) ([place]), Dyspnea, Heart attack ([place]) (2014), Heart murmur, History of kidney stones, History of migraine, Hyperlipidemia, Hypotension, Mesothelioma ([place]), Nocturnal oxygen desaturation, Palpitations, Pleural effusion, Pneumonia, and Prostate cancer ([place]).   He presents to the office today for bright red blood per rectum.   He had a Lovenox injection while in the hospital 3 days ago after he went excision of right chest wall mass on 06/29/2016. His wife reports that he had two episodes of bright red blood on toilet paper today. She did not notice any blood in stool but there was some in the bowl. He had a hard bowl movement and was constipated after being seen in the hospital.    Review of Systems  See HPI   Past Medical History:  Diagnosis Date  . Alzheimer disease   . Anoxic brain injury ([place])   . Asbestosis ([place])   . Cardiac arrest ([place])   . CHF (congestive heart failure) ([place])   . Dyspnea   . Heart attack [facility]) 2014   Cardiac Arrest   . Heart murmur   . History of kidney stones   . History of migraine   . Hyperlipidemia   . Hypotension   . Mesothelioma ([place])   . Nocturnal oxygen desaturation    Wife reports that patient is suppose to be on oxygen at night but takes it off  . Palpitations   . Pleural effusion    multiple  . Pneumonia    2016  . Prostate cancer [facility])     Social History   Socioeconomic History  . Marital status: Married    Spouse name: Not on file  . Number of children: Not on file  . Years of education: Not on file  . Highest education level: Not on file  Social Needs  . Financial resource strain: Not on file  . Food insecurity - worry: Not on file  . Food insecurity - inability: Not  on file  . Transportation needs - medical: Not on file  . Transportation needs - non-medical: Not on file  Occupational History  . Not on file  Tobacco Use  . Smoking status: Never Smoker  . Smokeless tobacco: Never Used  Substance and Sexual Activity  . Alcohol use: No  . Drug use: No  . Sexual activity: Not on file  Other Topics Concern  . Not on file  Social History Narrative   Pt lives in 1 story home with his wife   Has 3 adult children   Retired Event organiser   Some college    Past Surgical History:  Procedure Laterality Date  . CARDIAC SURGERY    . CORONARY ARTERY BYPASS GRAFT  2014   3 vessel  . INGUINAL HERNIA REPAIR Right 11/2008  . MASS EXCISION Right 06/29/2017   Procedure: EXCISION OF SUBCUTANEOUS RIGHT CHEST WALL MASS;  Surgeon: Donnie Mesa, MD;  Location: [place];  Service: General;  Laterality: Right;  . PROSTATECTOMY  10/2007  . THORACENTESIS    . TUBE THORACOSTOMY ([facility] HX)      Family History  Problem Relation Age of Onset  . Heart disease Mother   . Heart disease Brother     Allergies  Allergen  Reactions  . Erythromycin Nausea And Vomiting    Current Outpatient Medications on File Prior to Visit  Medication Sig Dispense Refill  . aspirin EC 81 MG tablet Take 81 mg by mouth daily.    Marland Kitchen atorvastatin (LIPITOR) 40 MG tablet Take 1 tablet (40 mg total) by mouth at bedtime. 90 tablet 3  . Cholecalciferol (VITAMIN D3) 5000 units CAPS Take 5,000 Units by mouth daily.     . furosemide (LASIX) 20 MG tablet Take 20 mg by mouth every Monday, Wednesday, and Friday. In the morning.    Marland Kitchen HYDROcodone-acetaminophen (NORCO/VICODIN) 5-325 MG tablet Take 1 tablet by mouth every 6 (six) hours as needed for moderate pain. 20 tablet 0  . memantine (NAMENDA) 10 MG tablet Take 1 tablet (10 mg total) by mouth 2 (two) times daily. 180 tablet 3  . metoprolol succinate (TOPROL-XL) 25 MG 24 hr tablet Take 1 tablet (25 mg total) by mouth daily. 90 tablet 3  .  Multiple Vitamins-Minerals (MACULAR VITAMIN BENEFIT) TABS Take 1 tablet by mouth daily.    . potassium chloride (K-DUR,KLOR-CON) 10 MEQ tablet Take 10 mEq by mouth every Monday, Wednesday, and Friday. In the morning.    . predniSONE (DELTASONE) 2.5 MG tablet Take 2.5 mg by mouth every other day. In the morning.    . rivastigmine (EXELON) 9.5 mg/24hr Place 1 patch (9.5 mg total) onto the skin daily. 90 patch 3  . thiamine 100 MG tablet Take 100 mg by mouth daily.    . traZODone (DESYREL) 100 MG tablet Take 100 mg by mouth at bedtime as needed for sleep.     No current facility-administered medications on file prior to visit.     BP (!) 148/84 (BP Location: Left Arm)   Temp (!) 97.5 F (36.4 C) (Oral)   Wt 119 lb (54 kg)   BMI 18.09 kg/m       Objective:   Physical Exam  Constitutional: He appears cachectic. No distress.  Cardiovascular: Normal rate, regular rhythm, normal heart sounds and intact distal pulses. Exam reveals no gallop and no friction rub.  No murmur heard. Pulmonary/Chest: Effort normal and breath sounds normal. No respiratory distress. He has no wheezes. He has no rales. He exhibits no tenderness.  Genitourinary: Rectal exam shows external hemorrhoid and anal tone abnormal. Rectal exam shows no internal hemorrhoid and guaiac negative stool.  Skin: Skin is warm and dry. No rash noted. He is not diaphoretic. No erythema. No pallor.  Surgical scar on right chest wall - appears to be healing well.   Psychiatric: He has a normal mood and affect. His behavior is normal. Judgment and thought content normal.  Nursing note and vitals reviewed.     Assessment & Plan:  1. Bright red blood per rectum - likely from hemorrhoid. Exacerbated by Lovenox  - Guaiac negative - Do not think a referral to GI is needed at this time  - Follow up if patient continues to have BRBPR  Dorothyann Peng, NP

## 2017-07-02 NOTE — Telephone Encounter (Signed)
Pt's wife called regarding some rectal bleeding that her husband had experienced this morning. She stated that he had blood on the toilet tissue and the commode water was red. He denies pain, or diarrhea. His last colonoscopy was August 2011. He went to the bathroom while the RN was on the phone with the wife and she states that he had a regular bm and no bleeding this last time. Appointment made for today with his pcp per protocol.  Home care advice given to pt's wife with verbal understanding.  Reason for Disposition . MODERATE rectal bleeding (small blood clots, passing blood without stool, or toilet water turns red)  Answer Assessment - Initial Assessment Questions 1. APPEARANCE of BLOOD: "What color is it?" "Is it passed separately, on the surface of the stool, or mixed in with the stool?"      Bright red 2. AMOUNT: "How much blood was passed?"      In commode, red  3. FREQUENCY: "How many times has blood been passed with the stools?"      First time 4. ONSET: "When was the blood first seen in the stools?" (Days or weeks)      Not seen any stools 5. DIARRHEA: "Is there also some diarrhea?" If so, ask: "How many diarrhea stools were passed in past 24 hours?"      no 6. CONSTIPATION: "Do you have constipation?" If so, "How bad is it?"     Not sure 7. RECURRENT SYMPTOMS: "Have you had blood in your stools before?" If so, ask: "When was the last time?" and "What happened that time?"      no 8. BLOOD THINNERS: "Do you take any blood thinners?" (e.g., Coumadin/warfarin, Pradaxa/dabigatran, aspirin)     Taking baby aspirin, had lovenox  Once in the hosp 9. OTHER SYMPTOMS: "Do you have any other symptoms?"  (e.g., abdominal pain, vomiting, dizziness, fever)     no 10. PREGNANCY: "Is there any chance you are pregnant?" "When was your last menstrual period?"       n/a  Protocols used: RECTAL BLEEDING-A-AH

## 2017-07-13 ENCOUNTER — Ambulatory Visit (INDEPENDENT_AMBULATORY_CARE_PROVIDER_SITE_OTHER): Payer: Medicare Other | Admitting: Neurology

## 2017-07-13 ENCOUNTER — Encounter: Payer: Self-pay | Admitting: Neurology

## 2017-07-13 ENCOUNTER — Other Ambulatory Visit: Payer: Self-pay

## 2017-07-13 VITALS — BP 92/56 | HR 60 | Ht 68.0 in | Wt 113.0 lb

## 2017-07-13 DIAGNOSIS — F03B18 Unspecified dementia, moderate, with other behavioral disturbance: Secondary | ICD-10-CM

## 2017-07-13 DIAGNOSIS — I2510 Atherosclerotic heart disease of native coronary artery without angina pectoris: Secondary | ICD-10-CM

## 2017-07-13 DIAGNOSIS — F0391 Unspecified dementia with behavioral disturbance: Secondary | ICD-10-CM | POA: Diagnosis not present

## 2017-07-13 NOTE — Patient Instructions (Signed)
Continue current medications. Follow-up in 6 months, call for any changes.  FALL PRECAUTIONS: Be cautious when walking. Scan the area for obstacles that may increase the risk of trips and falls. When getting up in the mornings, sit up at the edge of the bed for a few minutes before getting out of bed. Consider elevating the bed at the head end to avoid drop of blood pressure when getting up. Walk always in a well-lit room (use night lights in the walls). Avoid area rugs or power cords from appliances in the middle of the walkways. Use a walker or a cane if necessary and consider physical therapy for balance exercise. Get your eyesight checked regularly.  FINANCIAL OVERSIGHT: Supervision, especially oversight when making financial decisions or transactions is also recommended.  HOME SAFETY: Consider the safety of the kitchen when operating appliances like stoves, microwave oven, and blender. Consider having supervision and share cooking responsibilities until no longer able to participate in those. Accidents with firearms and other hazards in the house should be identified and addressed as well.  DRIVING: Regarding driving, in patients with progressive memory problems, driving will be impaired. We advise to have someone else do the driving if trouble finding directions or if minor accidents are reported. Independent driving assessment is available to determine safety of driving.  ABILITY TO BE LEFT ALONE: If patient is unable to contact 911 operator, consider using LifeLine, or when the need is there, arrange for someone to stay with patients. Smoking is a fire hazard, consider supervision or cessation. Risk of wandering should be assessed by caregiver and if detected at any point, supervision and safe proof recommendations should be instituted.  MEDICATION SUPERVISION: Inability to self-administer medication needs to be constantly addressed. Implement a mechanism to ensure safe administration of the  medications.  RECOMMENDATIONS FOR ALL PATIENTS WITH MEMORY PROBLEMS: 1. Continue to exercise (Recommend 30 minutes of walking everyday, or 3 hours every week) 2. Increase social interactions - continue going to [place] and enjoy social gatherings with friends and family 3. Eat healthy, avoid fried foods and eat more fruits and vegetables 4. Maintain adequate blood pressure, blood sugar, and blood cholesterol level. Reducing the risk of stroke and cardiovascular disease also helps promoting better memory. 5. Avoid stressful situations. Live a simple life and avoid aggravations. Organize your time and prepare for the next day in anticipation. 6. Sleep well, avoid any interruptions of sleep and avoid any distractions in the bedroom that may interfere with adequate sleep quality 7. Avoid sugar, avoid sweets as there is a strong link between excessive sugar intake, diabetes, and cognitive impairment We discussed the Mediterranean diet, which has been shown to help patients reduce the risk of progressive memory disorders and reduces cardiovascular risk. This includes eating fish, eat fruits and green leafy vegetables, nuts like almonds and hazelnuts, walnuts, and also use olive oil. Avoid fast foods and fried foods as much as possible. Avoid sweets and sugar as sugar use has been linked to worsening of memory function.  There is always a concern of gradual progression of memory problems. If this is the case, then we may need to adjust level of care according to patient needs. Support, both to the patient and caregiver, should then be put into place.
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
## 2017-08-17 ENCOUNTER — Ambulatory Visit: Payer: Self-pay | Admitting: *Deleted

## 2017-08-17 ENCOUNTER — Ambulatory Visit: Payer: Medicare Other | Admitting: Adult Health

## 2017-08-17 ENCOUNTER — Ambulatory Visit (INDEPENDENT_AMBULATORY_CARE_PROVIDER_SITE_OTHER): Payer: Medicare Other | Admitting: Internal Medicine

## 2017-08-17 ENCOUNTER — Ambulatory Visit (INDEPENDENT_AMBULATORY_CARE_PROVIDER_SITE_OTHER)
Admission: RE | Admit: 2017-08-17 | Discharge: 2017-08-17 | Disposition: A | Discharge status: Home / Self Care | Payer: Medicare Other | Source: Ambulatory Visit | Attending: Internal Medicine | Admitting: Internal Medicine

## 2017-08-17 ENCOUNTER — Encounter: Payer: Self-pay | Admitting: Internal Medicine

## 2017-08-17 VITALS — BP 108/60 | HR 58 | Ht 68.0 in | Wt 115.0 lb

## 2017-08-17 DIAGNOSIS — C459 Mesothelioma, unspecified: Secondary | ICD-10-CM

## 2017-08-17 DIAGNOSIS — J9611 Chronic respiratory failure with hypoxia: Secondary | ICD-10-CM | POA: Diagnosis not present

## 2017-08-17 DIAGNOSIS — I2510 Atherosclerotic heart disease of native coronary artery without angina pectoris: Secondary | ICD-10-CM

## 2017-08-17 NOTE — Progress Notes (Addendum)
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 with pt or spouse) with dx of R > L  pleural effusions in [facility] 2016  Clinical Dx of mesothelioma but note even at [facility] 07/2014 no malignancy seen so Never proven s/p talc on R with last procedure done   Feb 2017 and turned down by T surgery  for futher intervention  referred to pulmonary clinic 12/24/2016 by Dorothyann Peng p arriving in [place] Aug 2018 to establish care here    Also has Mild MR with EF40% by [facility] records last study 08/11/16     12/24/2016 1st [facility] office visit/ [facility]   Chief Complaint  Patient presents with  . Advice Only    Pt had a CT scan done 11/09/16 that stated that he had mesothelioma. Pt's wife stated that pt needs to have another biopsy but pt was in the process of moving and is now here for the referral. Pt does have SOB on exertion. Denies any coughing or CP. Pt was supposed to be on 2L O2 at bedtime but with the move was unable to get the O2.  sleeping one pillow on 2lpm - takes it off during the night s sequelae / owns concentrator  Doe = MMRC2 = can't walk a nl pace on a flat grade s sob but does fine slow and flat  No cough or cp an apparently good appetite rec Wear the 02 at bedtime as much as you can  - see if you can get the company to help you turn the alarm off   Dr Earlie Server eval > declined chemo     08/17/2017  Acute ov/[facility] re: worsening sob/ desats with walking on 2lpm / only has inogen max 2lpm  Chief Complaint  Patient presents with  . Acute Visit    Increased SOB x 2 wks- o2 helps some and he has been using this 24/7 now rather than just at night. He is getting winded walking short distances. He does not cough much but does clear his throat frequently.   gradually worse sob with activity x 2 weeks, still ok at rest, sleeps propped  way up  Doe= MMRC4  = sob if tries to leave home or while getting dressed  Still swallowing ok    No obvious day to day or daytime variability or assoc excess/ purulent sputum or mucus plugs or hemoptysis or cp or chest tightness, subjective wheeze or overt sinus or hb symptoms. No unusual exposure hx or h/o childhood pna/ asthma or knowledge of premature birth.  Sleeping propped up at > 30 degrees without nocturnal  or early am exacerbation  of respiratory  c/o's or need for noct saba. Also denies any obvious fluctuation of symptoms with weather or environmental changes or other aggravating or alleviating factors except as outlined above   Current Allergies, Complete Past Medical History, Past Surgical History, Family History, and Social History were reviewed in [employer] record.  ROS  The following are not active complaints unless bolded Hoarseness, sore throat, dysphagia, dental problems, itching, sneezing,  nasal congestion or discharge of excess mucus or purulent secretions, ear ache,   fever, chills, sweats, unintended wt loss or wt gain, classically pleuritic or exertional cp,  orthopnea pnd or leg swelling, presyncope,  palpitations, abdominal pain, anorexia, nausea, vomiting, diarrhea  or change in bowel habits or change in bladder habits, change in stools or change in urine, dysuria, hematuria,  rash, arthralgias, visual complaints, headache, numbness, weakness generalizedor ataxia or problems with walking or coordination,  change in mood/affect or memory.        Current Meds  Medication Sig  . aspirin EC 81 MG tablet Take 81 mg by mouth daily.  Marland Kitchen atorvastatin (LIPITOR) 40 MG tablet Take 1 tablet (40 mg total) by mouth at bedtime.  . Cholecalciferol (VITAMIN D3) 5000 units CAPS Take 5,000 Units by mouth daily.   . furosemide (LASIX) 20 MG tablet Take 20 mg by mouth every Monday, Wednesday, and Friday. In the morning.  . memantine (NAMENDA) 10 MG tablet Take 1  tablet (10 mg total) by mouth 2 (two) times daily.  . metoprolol succinate (TOPROL-XL) 25 MG 24 hr tablet Take 1 tablet (25 mg total) by mouth daily.  . Multiple Vitamins-Minerals (MACULAR VITAMIN BENEFIT) TABS Take 1 tablet by mouth daily.  . potassium chloride (K-DUR,KLOR-CON) 10 MEQ tablet Take 10 mEq by mouth every Monday, Wednesday, and Friday. In the morning.  . predniSONE (DELTASONE) 2.5 MG tablet Take 2.5 mg by mouth every other day. In the morning.  . rivastigmine (EXELON) 9.5 mg/24hr Place 1 patch (9.5 mg total) onto the skin daily.  Marland Kitchen thiamine 100 MG tablet Take 100 mg by mouth daily.  . traZODone (DESYREL) 100 MG tablet Take 100 mg by mouth at bedtime as needed for sleep.                        Objective:   Physical Exam     W/c bound  Terminally ill appearing wm nad lets his wife answer the questions, mild increased wob at rest   08/17/2017          115   12/24/16 127 lb 11.2 oz (57.9 kg)  12/23/16 127 lb (57.6 kg)     Vital signs reviewed - Note on arrival 02 sats  93% on  2lpm     HEENT: nl dentition, turbinates bilaterally, and oropharynx. Nl external ear canals without cough reflex   NECK :  without JVD/Nodes/TM/ nl carotid upstrokes bilaterally   LUNGS: no acc muscle use,  Mild/ mod kyphotic chest wall with decreased breath sounds and dullness worse on the right than on the left no localized or generalized wheezing.  CV:  RRR  no s3 or murmur or increase in P2, and no edema   ABD:  soft and nontender with  bowel sounds nl  MS:   ext warm without deformities, calf tenderness, cyanosis or clubbing No obvious joint restrictions   SKIN: warm and dry without lesions    NEURO:  alert, slowed mentation/ responses/    no obvious motor or cerebellar deficits apparent.                                     Assessment:

## 2017-08-17 NOTE — Telephone Encounter (Signed)
I spoke with Devin Hurst and pt has appointment with pulmonary this afternoon, did cancel for here today.

## 2017-08-17 NOTE — Patient Instructions (Addendum)
Increase 02 as high 4lpm and let Devin Hurst know that you would like to consider the hospice care option at his point   Try prilosec otc 20mg   Take 30-60 min before first meal of the day and Pepcid ac (famotidine) 20 mg one @  bedtime until  Throat clearing  completely gone for at least a week without the need for cough suppression  GERD (REFLUX)  is an extremely common cause of respiratory symptoms just like yours , many times with no obvious heartburn at all.    It can be treated with medication, but also with lifestyle changes including elevation of the head of your bed (ideally with 6 inch  bed blocks),  Smoking cessation, avoidance of late meals, excessive alcohol, and avoid fatty foods, chocolate, peppermint, colas, red wine, and acidic juices such as orange juice.  NO MINT OR MENTHOL PRODUCTS SO NO COUGH DROPS   USE SUGARLESS CANDY INSTEAD (Jolley ranchers or Stover's or Life Savers) or even ice chips will also do - the key is to swallow to prevent all throat clearing. NO OIL BASED VITAMINS - use powdered substitutes.    Please remember to go to the  x-ray department downstairs in the basement  for your tests - we will call you with the results when they are available.      Pulmonary follow up is as needed

## 2017-08-17 NOTE — Telephone Encounter (Signed)
Pt with H/O Mesothelioma . Pt's wife reports pt has had progressive SOB x 1 week; at rest and with exertion. Pt has O2 concentrator. States O2 sats range from 78% if pt "briefly" off O2 to 92% with O2.  Wife is requesting appt and documentation from PCP for insurance approval for O2 tank. Wife states pt is terminal; tearful during call. NT spoke with Sunday Spillers. Appt made with C. Nafziger today. Care advise given per protocol. Reason for Disposition . [1] Longstanding difficulty breathing AND [2] not responding to usual therapy  Answer Assessment - Initial Assessment Questions 1. RESPIRATORY STATUS: "Describe your breathing?" (e.g., wheezing, shortness of breath, unable to speak, severe coughing)      Increased SOB, at rest and with exersion 2. ONSET: "When did this breathing problem begin?"      Worsening x 1 week. Pt's wife requests appt to have documentation pt needs O2 tank instead of concentrator. 3. PATTERN "Does the difficult breathing come and go, or has it been constant since it started?"      Sat low 78% when briefly removes cannula.  92% with O2 4. SEVERITY: "How bad is your breathing?" (e.g., mild, moderate, severe)    - MILD: No SOB at rest, mild SOB with walking, speaks normally in sentences, can lay down, no retractions, pulse < 100.    - MODERATE: SOB at rest, SOB with minimal exertion and prefers to sit, cannot lie down flat, speaks in phrases, mild retractions, audible wheezing, pulse 100-120.    - SEVERE: Very SOB at rest, speaks in single words, struggling to breathe, sitting hunched forward, retractions, pulse > 120      VAries; Severe with exersion 5. RECURRENT SYMPTOM: "Have you had difficulty breathing before?" If so, ask: "When was the last time?" and "What happened that time?"       6. CARDIAC HISTORY: "Do you have any history of heart disease?" (e.g., heart attack, angina, bypass surgery, angioplasty)       7. LUNG HISTORY: "Do you have any history of lung disease?"  (e.g.,  pulmonary embolus, asthma, emphysema)     Yes 8. CAUSE: "What do you think is causing the breathing problem?"      Chronic 9. OTHER SYMPTOMS: "Do you have any other symptoms? (e.g., dizziness, runny nose, cough, chest pain, fever)    No  10. PREGNANCY: "Is there any chance you are pregnant?" "When was your last menstrual period?"        11. TRAVEL: "Have you traveled out of the country in the last month?" (e.g., travel history, exposures)       No  Protocols used: BREATHING DIFFICULTY-A-AH

## 2017-08-18 ENCOUNTER — Encounter: Payer: Self-pay | Admitting: Internal Medicine

## 2017-08-18 NOTE — Progress Notes (Signed)
Spoke with the pt's spouse and notified of results and she verbalized understanding.

## 2017-08-18 NOTE — Assessment & Plan Note (Addendum)
S/p VATS  07/2014 no malignancy > talc pleurodesis   Chest CT  11/09/16 Progression of extensive bilateral pleural based densities R >>L with mod r and smaller L loculated effusions and calcified pleural plaques  - R chest wall mass bx 06/29/17 :  Pos mesothelioma - progression on cxr 08/17/2017 rec hospice referral  Discussed in detail all the  indications, usual  risks and alternatives  relative to the benefits with patient/wife who agree  to proceed with conservative f/u as outlined  > defer hospice referral to PCP

## 2017-08-18 NOTE — Assessment & Plan Note (Signed)
Placed on 02 in [place] ? 2017 > owns concentrator but not using consistently as of  12/24/2016  - as of 08/17/2017 needs 24/7 titrate to sats > 90% if possible to palliate sob  > deferred rx  to hospice

## 2017-08-20 ENCOUNTER — Telehealth: Payer: Self-pay

## 2017-08-20 NOTE — Telephone Encounter (Signed)
Copied from [place] [phone]. Topic: Referral - Request >> Aug 20, 2017 12:36 PM Lennox Solders wrote: Reason for CRM: pt wife is calling requesting hospice referral for her husband due to mesothelioma and pt needs oxygen. Pt saw dr wert this week

## 2017-08-24 ENCOUNTER — Encounter: Payer: Self-pay | Admitting: Family Medicine

## 2017-08-24 NOTE — Telephone Encounter (Signed)
Wife calling back to follow up on this issue.  Pt needs O2 all the time now. Not just at night. Dr Melvyn Novas suggested Tommi Rumps order the hospice and the oxygen.  Does pt needs to be seen?  I did make pt an appointment for Friday at 2 pm.   If pt doesn't need, please call her.

## 2017-08-24 NOTE — Telephone Encounter (Signed)
Order sent by fax.  Remo Lipps [facility]) notified that referral has been sent by fax to hospice.  Advised a call back if needed.  No further action required.

## 2017-08-25 ENCOUNTER — Telehealth: Payer: Self-pay

## 2017-08-25 NOTE — Telephone Encounter (Signed)
That is fine 

## 2017-08-25 NOTE — Telephone Encounter (Signed)
Spoke with Pam and advised. Nothing further needed.

## 2017-08-25 NOTE — Telephone Encounter (Signed)
Copied from [place] [id number]. Topic: Quick Communication - See Telephone Encounter >> Aug 25, 2017 12:17 PM Antonieta Iba C wrote: CRM for notification. See Telephone encounter for: 08/25/17.  Hospice nurse Newman Nickels called in. Family is requesting a do not resuscitate. She would like to know if it is okay for hospice physician to sign form?   C:B" [phone]

## 2017-08-26 ENCOUNTER — Telehealth: Payer: Self-pay | Admitting: Adult Health

## 2017-08-26 NOTE — Telephone Encounter (Signed)
Ok for Ativan 0.25 mg QHS PRN 30 pills, 2 refills

## 2017-08-26 NOTE — Telephone Encounter (Signed)
Copied from [place] [phone]. Topic: Quick Communication - See Telephone Encounter >> Aug 26, 2017 12:57 PM Ivar Drape wrote: CRM for notification. See Telephone encounter for: 08/26/17. Thomes Dinning w/[facility] in [place] would like something prescribed for the patient's anxiety.  Patient get's aggitated in the evenings. Please call ASAP.  If you can't get Thomes Dinning, please call the patient's spouse.

## 2017-08-27 ENCOUNTER — Ambulatory Visit: Payer: Medicare Other | Admitting: Adult Health

## 2017-08-27 MED ORDER — LORAZEPAM 0.5 MG PO TABS: 0.2500 mg | ORAL_TABLET | Freq: Every evening | ORAL | 2 refills | Status: AC | PRN

## 2017-08-27 NOTE — Telephone Encounter (Signed)
Remo Lipps (wife) notified that new rx was being sent to [facility]. No further action required.  Will close message.

## 2017-08-31 ENCOUNTER — Telehealth: Payer: Self-pay | Admitting: Adult Health

## 2017-08-31 NOTE — Telephone Encounter (Signed)
Copied from [place] [phone]. Topic: Quick Communication - See Telephone Encounter >> Aug 31, 2017  4:55 PM Cleaster Corin, NT wrote: CRM for notification. See Telephone encounter for: 08/31/17.  Sam king ([facility] calling to receive Verbal orders for Miralax  prn for pt.  Sam can be reached ar [phone]

## 2017-09-01 NOTE — Telephone Encounter (Signed)
Ok for verbal orders ?

## 2017-09-01 NOTE — Telephone Encounter (Signed)
Left a detailed voicemail for Devin Hurst instructing him to proceed with order for Miralax PRN.

## 2017-09-07 ENCOUNTER — Telehealth: Payer: Self-pay | Admitting: Adult Health

## 2017-09-07 NOTE — Telephone Encounter (Signed)
Copied from [place] [phone]. Topic: Quick Communication - See Telephone Encounter >> Sep 07, 2017  3:19 PM Valla Leaver wrote: CRM for notification. See Telephone encounter for: 09/07/17. Thomes Dinning, RN with  [facility], calling with report on home care:  Vitals:104/64 Heart Rate:88 Respiration:20 Oxygen Saturation: 99% (on 4liters per minute)  Patient reported pain on left side of chest. RN, Sam, is concerned about increased dyspnea, increaing shortness of breath, and increased fatigue.   Wife says he can walk four steps with oxygen before getting short of breath. They went to the courthouse to fill out [place] papers today which exhausted him.   Patient does not get short of breath at rest.   Rn, Sam, is wondering if liquid morphine might be appropriate at this time for the patients shortness of breath?  AJ#[phone]

## 2017-09-08 NOTE — Telephone Encounter (Signed)
I do not think I am the " attending", I think Hospice is doing medication management

## 2017-09-08 NOTE — Telephone Encounter (Signed)
Called and spoke to Sam, Therapist, sports and informed him that Tommi Rumps is not the attending physician.  Please contact hospice physician.

## 2017-09-09 ENCOUNTER — Telehealth: Payer: Self-pay | Admitting: Family Medicine

## 2017-09-09 NOTE — Telephone Encounter (Signed)
Sam is calling to clarify that cory will not be the attending physician . Sam said cory prescribed the miralax for this patient

## 2017-09-09 NOTE — Telephone Encounter (Signed)
Duplicate message. 

## 2017-09-09 NOTE — Telephone Encounter (Signed)
Copied from [place] [phone]. Topic: General - Other >> Sep 09, 2017  2:19 PM Yvette Rack wrote: Reason for CRM: Thomes Dinning, RN with  [facility] [place] [phone] is calling to speak with [facility] again about attending physician she states that he would like to speak with as soon as possible before she spek with the pt provider

## 2017-09-09 NOTE — Telephone Encounter (Signed)
Spoke to Sam and advised that Devin Hurst would like for hospice physician to take care of symptomatic prescriptions and Devin Hurst will still be PCP.  Sam has spoke to hospice physician and they will take care of meds.  Morphine has been prescribed.  See other telephone note.

## 2017-09-17 ENCOUNTER — Telehealth: Payer: Self-pay | Admitting: Adult Health

## 2017-09-17 NOTE — Telephone Encounter (Signed)
Noted  

## 2017-09-17 NOTE — Telephone Encounter (Signed)
Copied from [place] [phone]. Topic: Inquiry >> Sep 17, 2017 11:02 AM Tye [place] wrote: Thomes Dinning from hospice called/ call if needed @  [phone]  Reason for CRM: vital signs:  Oxygen saturation is @ 98% w/ oxygen @ 4 liters per min Pulse is 72 Respiration 22 Bp 104/68 Weight 113lbs  Increase in edema in feet Lungs are diminished  Lasix dose is 20mg  by mouth 3x/weekly mon/wed/fri

## 2017-09-21 ENCOUNTER — Telehealth: Payer: Self-pay | Admitting: Adult Health

## 2017-09-21 NOTE — Telephone Encounter (Signed)
Copied from [place] [phone]. Topic: Quick Communication - See Telephone Encounter >> Sep 21, 2017 11:16 AM Rutherford Nail, NT wrote: CRM for notification. See Telephone encounter for: 09/21/17. Shivan with [facility] is sending over a fax containing a CTI and supplimental order for Dr Elease Hashimoto to sign and fax back. Fax#: [phone].

## 2017-09-22 MED ORDER — IPRATROPIUM-ALBUTEROL 0.5-2.5 (3) MG/3ML IN SOLN: 3.0000 mL | RESPIRATORY_TRACT | 1 refills | Status: AC | PRN

## 2017-09-22 NOTE — Telephone Encounter (Signed)
Placed in basket to be faxed

## 2017-09-22 NOTE — Telephone Encounter (Signed)
Left VM to call back 

## 2017-09-22 NOTE — Telephone Encounter (Signed)
Spoke to Thomes Dinning, RN with [facility]. Patient with SOB, hospice has taken Nebulizer machine to patients house and would like an order for solution to help with SOB

## 2017-09-22 NOTE — Telephone Encounter (Signed)
Sam, nurse with hospice calling back.  Sam would like a call back asap. Sam wanted to discuss pt's issues,, and if Tommi Rumps wanted to make any changes. Sam was hoping to hear back last week. A nurse on the way to see pt for increases SOB Please call: [phone]

## 2017-09-23 ENCOUNTER — Encounter: Payer: Self-pay | Admitting: Family Medicine

## 2017-10-16 DEATH — deceased

## 2018-01-21 ENCOUNTER — Ambulatory Visit: Payer: Medicare Other | Admitting: Neurology

## 2020-03-15 IMAGING — DX DG CHEST 2V
2 series · 2 of 2 positions shown · non-contrast
Comparison: 01/04/2017 PET/CT

CLINICAL DATA: Chronic sob AND worsening, hx of Asbestoses, CHF,
right-sided mesothelioma

EXAM:
CHEST - 2 VIEW

[chest pa]
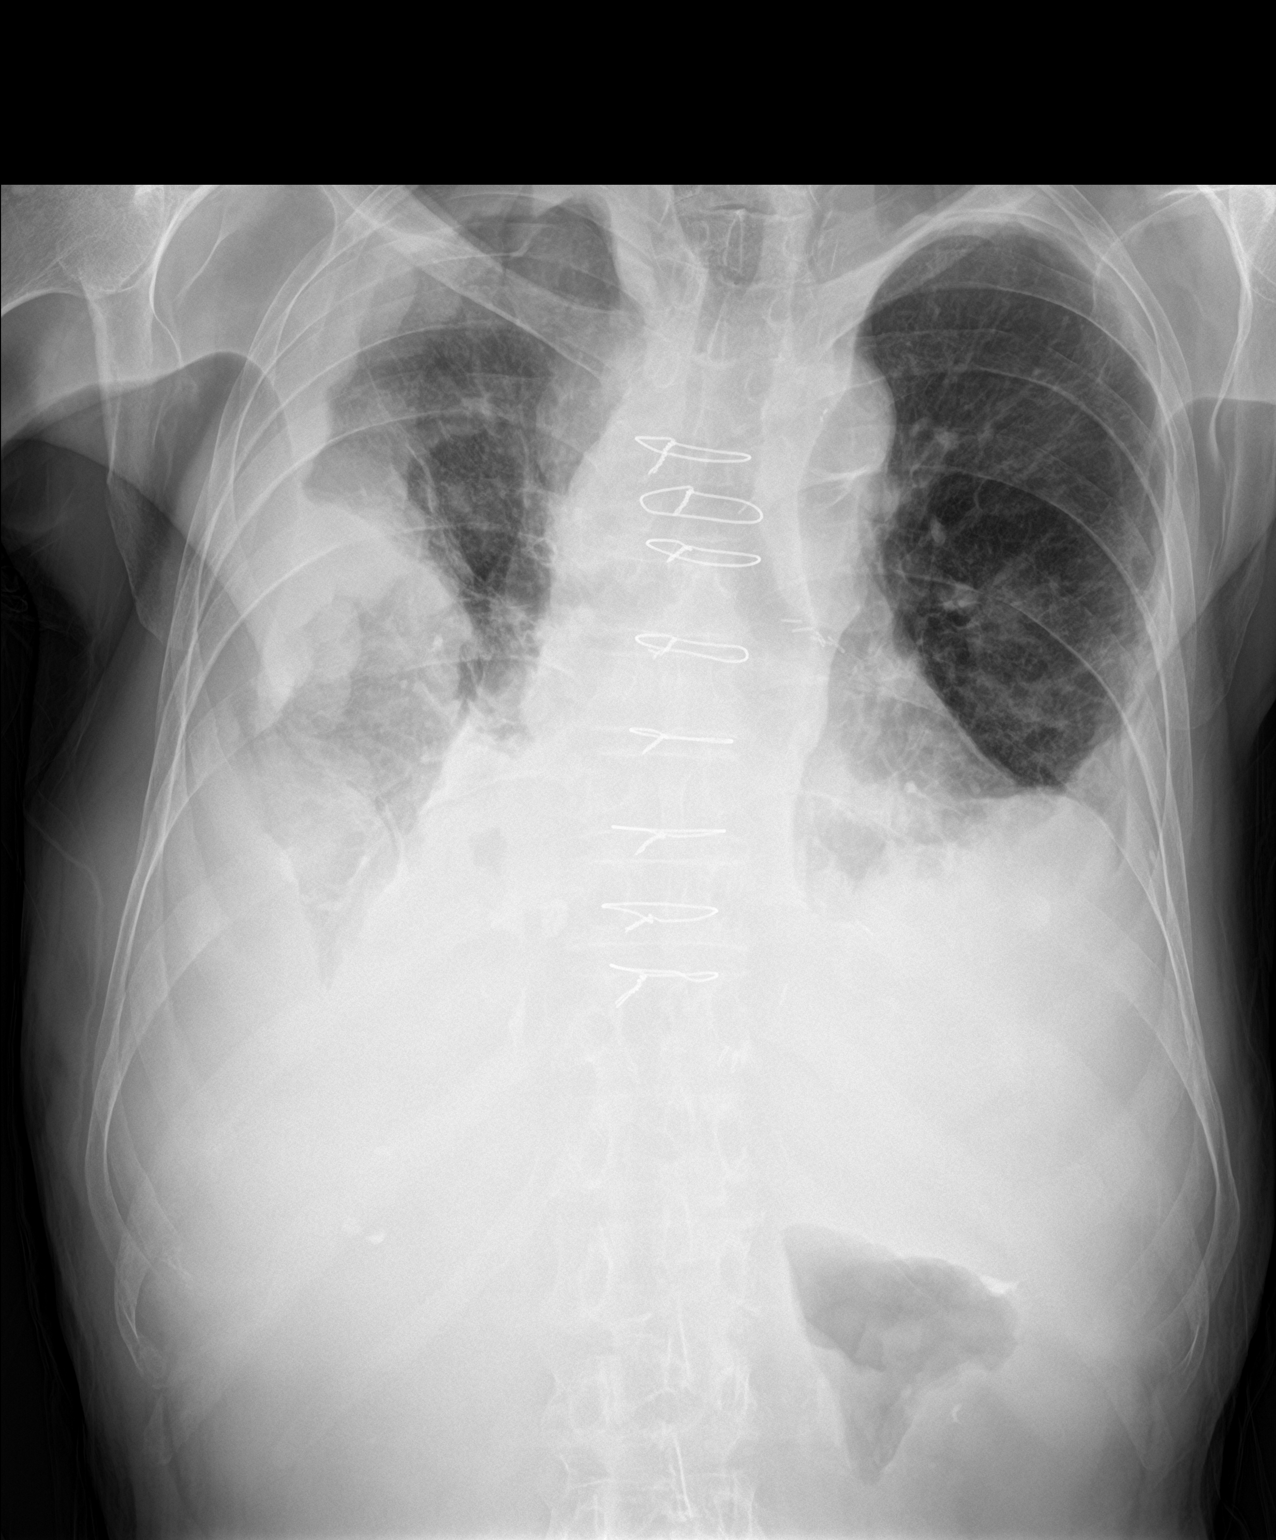

[chest lat]
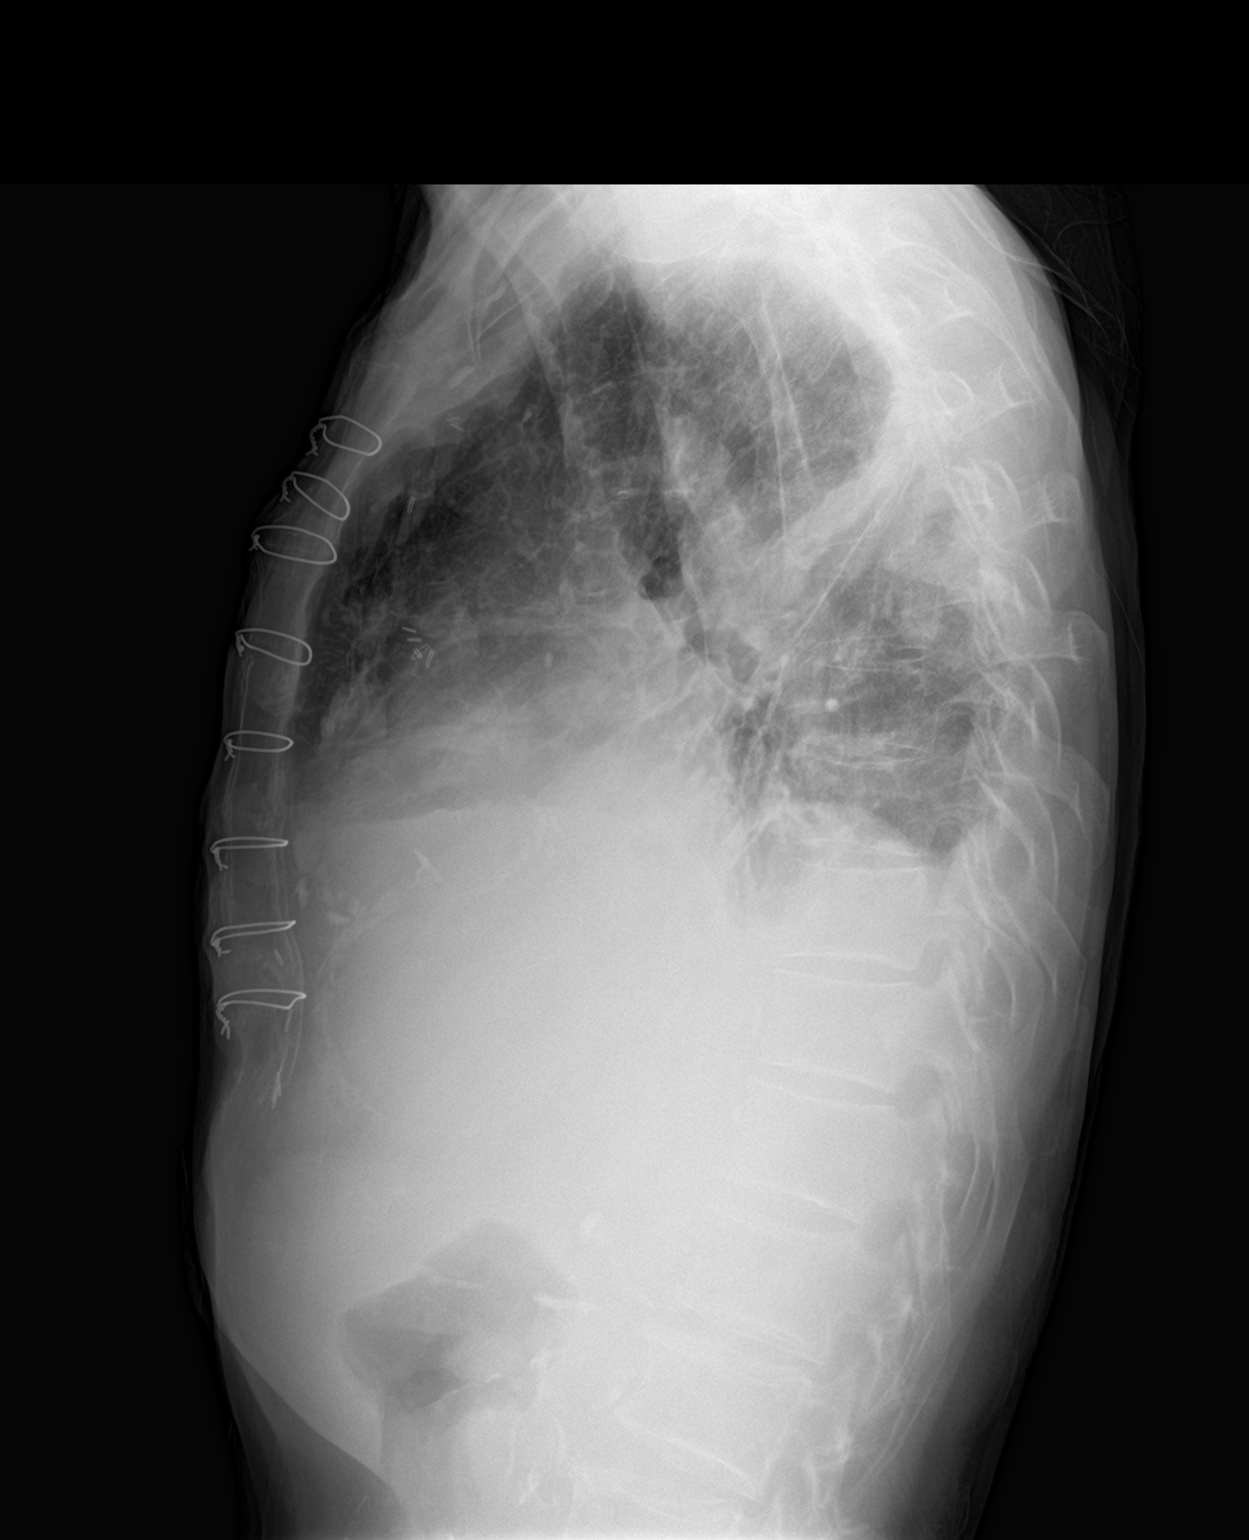

[2 of 2 positions shown; findings below may reference images not displayed]

FINDINGS: Lobulated large pleural masses or effusions noted bilaterally, right
greater than left, with further reduction in aerated lung
bilaterally.

Heart borders are obscured but underlying cardiomegaly is strongly
favored..

Atherosclerotic calcification of the aortic arch.

Prior median sternotomy.
IMPRESSION: 1. Progression of bilateral pleural masses and loculated effusions
with reduced aerated lung resulting.
2. Cardiomegaly without overt edema.
3.  Aortic Atherosclerosis ([id number]).
